# Patient Record
Sex: Female | Born: 1950 | Race: White | Hispanic: No | Marital: Married | Smoking: Former smoker
Health system: Southern US, Community
[De-identification: ages and names within clinical notes are randomized; demographics above are authoritative.]

## PROBLEM LIST (undated history)

## (undated) DIAGNOSIS — B029 Zoster without complications: Secondary | ICD-10-CM

## (undated) DIAGNOSIS — K5792 Diverticulitis of intestine, part unspecified, without perforation or abscess without bleeding: Secondary | ICD-10-CM

## (undated) DIAGNOSIS — Z87442 Personal history of urinary calculi: Secondary | ICD-10-CM

## (undated) DIAGNOSIS — M1990 Unspecified osteoarthritis, unspecified site: Secondary | ICD-10-CM

## (undated) HISTORY — PX: BREAST CYST ASPIRATION: SHX578

---

## 1992-03-09 HISTORY — PX: TUBAL LIGATION: SHX77

## 2013-12-21 ENCOUNTER — Other Ambulatory Visit: Payer: Self-pay | Admitting: Orthopedic Surgery

## 2013-12-21 NOTE — Progress Notes (Signed)
Preoperative surgical orders have been place into the Epic hospital system for Marisue Humble on 12/21/2013, 1:09 PM  by Mickel Crow for surgery on 01/10/2014.  Preop Total Hip - Anterior Approach orders including Experel Injecion, IV Tylenol, and IV Decadron as long as there are no contraindications to the above medications. Arlee Muslim, PA-C

## 2013-12-27 ENCOUNTER — Encounter (HOSPITAL_COMMUNITY): Payer: Self-pay | Admitting: Pharmacy Technician

## 2014-01-02 ENCOUNTER — Encounter (HOSPITAL_COMMUNITY)
Admission: RE | Admit: 2014-01-02 | Discharge: 2014-01-02 | Disposition: A | Discharge status: Home / Self Care | Payer: BC Managed Care – PPO | Source: Ambulatory Visit | Attending: Orthopedic Surgery | Admitting: Orthopedic Surgery

## 2014-01-02 ENCOUNTER — Encounter (HOSPITAL_COMMUNITY): Payer: Self-pay

## 2014-01-02 ENCOUNTER — Ambulatory Visit (HOSPITAL_COMMUNITY)
Admission: RE | Admit: 2014-01-02 | Discharge: 2014-01-02 | Disposition: A | Discharge status: Home / Self Care | Payer: BC Managed Care – PPO | Source: Ambulatory Visit | Attending: Orthopedic Surgery | Admitting: Orthopedic Surgery

## 2014-01-02 DIAGNOSIS — Z01818 Encounter for other preprocedural examination: Secondary | ICD-10-CM | POA: Insufficient documentation

## 2014-01-02 DIAGNOSIS — M1611 Unilateral primary osteoarthritis, right hip: Secondary | ICD-10-CM | POA: Diagnosis not present

## 2014-01-02 HISTORY — DX: Zoster without complications: B02.9

## 2014-01-02 HISTORY — DX: Personal history of urinary calculi: Z87.442

## 2014-01-02 HISTORY — DX: Diverticulitis of intestine, part unspecified, without perforation or abscess without bleeding: K57.92

## 2014-01-02 HISTORY — DX: Unspecified osteoarthritis, unspecified site: M19.90

## 2014-01-02 LAB — CBC
HEMATOCRIT: 36.6 % (ref 36.0–46.0)
HEMOGLOBIN: 12.2 g/dL (ref 12.0–15.0)
MCH: 29.6 pg (ref 26.0–34.0)
MCHC: 33.3 g/dL (ref 30.0–36.0)
MCV: 88.8 fL (ref 78.0–100.0)
Platelets: 293 10*3/uL (ref 150–400)
RBC: 4.12 MIL/uL (ref 3.87–5.11)
RDW: 13.3 % (ref 11.5–15.5)
WBC: 4.4 10*3/uL (ref 4.0–10.5)

## 2014-01-02 LAB — ABO/RH: ABO/RH(D): A POS

## 2014-01-02 LAB — COMPREHENSIVE METABOLIC PANEL
ALT: 18 U/L (ref 0–35)
ANION GAP: 12 (ref 5–15)
AST: 23 U/L (ref 0–37)
Albumin: 4.5 g/dL (ref 3.5–5.2)
Alkaline Phosphatase: 66 U/L (ref 39–117)
BUN: 17 mg/dL (ref 6–23)
CALCIUM: 9.8 mg/dL (ref 8.4–10.5)
CO2: 27 meq/L (ref 19–32)
Chloride: 100 mEq/L (ref 96–112)
Creatinine, Ser: 0.7 mg/dL (ref 0.50–1.10)
GLUCOSE: 93 mg/dL (ref 70–99)
Potassium: 5 mEq/L (ref 3.7–5.3)
SODIUM: 139 meq/L (ref 137–147)
Total Bilirubin: 0.4 mg/dL (ref 0.3–1.2)
Total Protein: 7.4 g/dL (ref 6.0–8.3)

## 2014-01-02 LAB — URINALYSIS, ROUTINE W REFLEX MICROSCOPIC
Bilirubin Urine: NEGATIVE
GLUCOSE, UA: NEGATIVE mg/dL
HGB URINE DIPSTICK: NEGATIVE
Ketones, ur: NEGATIVE mg/dL
LEUKOCYTES UA: NEGATIVE
Nitrite: NEGATIVE
Protein, ur: NEGATIVE mg/dL
Specific Gravity, Urine: 1.006 (ref 1.005–1.030)
Urobilinogen, UA: 0.2 mg/dL (ref 0.0–1.0)
pH: 6 (ref 5.0–8.0)

## 2014-01-02 LAB — APTT: aPTT: 29 seconds (ref 24–37)

## 2014-01-02 LAB — SURGICAL PCR SCREEN
MRSA, PCR: NEGATIVE
STAPHYLOCOCCUS AUREUS: POSITIVE — AB

## 2014-01-02 LAB — PROTIME-INR
INR: 1.05 (ref 0.00–1.49)
Prothrombin Time: 13.8 seconds (ref 11.6–15.2)

## 2014-01-02 NOTE — Pre-Procedure Instructions (Signed)
NOTE OF MEDICAL CLEARANCE ON PT'S CHART FROM DR. SHAMLEFFER. EKG AND CXR NOT NEEDED PREOP PER PT'S MEDICAL HISTORY / ANESTHESIOLOGIST'S GUIDELINES.

## 2014-01-02 NOTE — Patient Instructions (Addendum)
YOUR SURGERY IS SCHEDULED AT [facility]  ON  Wednesday  11/4  REPORT TO  SHORT STAY CENTER AT:  8:45 AM   DO NOT EAT OR DRINK ANYTHING AFTER MIDNIGHT THE NIGHT BEFORE YOUR SURGERY.  YOU MAY BRUSH YOUR TEETH, RINSE OUT YOUR MOUTH--BUT NO WATER, NO FOOD, NO CHEWING GUM, NO MINTS, NO CANDIES, NO CHEWING TOBACCO.  PLEASE TAKE THE FOLLOWING MEDICATIONS THE AM OF YOUR SURGERY WITH A FEW SIPS OF WATER:  NO MEDICATIONS TO TAKE    DO NOT BRING VALUABLES, MONEY, CREDIT CARDS.  DO NOT WEAR JEWELRY, MAKE-UP, NAIL POLISH AND NO METAL PINS OR CLIPS IN YOUR HAIR. CONTACT LENS, DENTURES / PARTIALS, GLASSES SHOULD NOT BE WORN TO SURGERY AND IN MOST CASES-HEARING AIDS WILL NEED TO BE REMOVED.  BRING YOUR GLASSES CASE, ANY EQUIPMENT NEEDED FOR YOUR CONTACT LENS. FOR PATIENTS ADMITTED TO THE HOSPITAL--CHECK OUT TIME THE DAY OF DISCHARGE IS 11:00 AM.  ALL INPATIENT ROOMS ARE PRIVATE - WITH BATHROOM, TELEPHONE, TELEVISION AND WIFI INTERNET.    PLEASE BE AWARE THAT YOU MAY NEED ADDITIONAL BLOOD DRAWN DAY OF YOUR SURGERY  _______________________________________________________________________   [facility] - Preparing for Surgery Before surgery, you can play an important role.  Because skin is not sterile, your skin needs to be as free of germs as possible.  You can reduce the number of germs on your skin by washing with CHG (chlorahexidine gluconate) soap before surgery.  CHG is an antiseptic cleaner which kills germs and bonds with the skin to continue killing germs even after washing. Please DO NOT use if you have an allergy to CHG or antibacterial soaps.  If your skin becomes reddened/irritated stop using the CHG and inform your nurse when you arrive at Short Stay. Do not shave (including legs and underarms) for at least 48 hours prior to the first CHG shower.  You may shave your face/neck. Please follow these instructions carefully:  1.  Shower with CHG Soap the night before surgery and  the  morning of Surgery.  2.  If you choose to wash your hair, wash your hair first as usual with your  normal  shampoo.  3.  After you shampoo, rinse your hair and body thoroughly to remove the  shampoo.                           4.  Use CHG as you would any other liquid soap.  You can apply chg directly  to the skin and wash                       Gently with a scrungie or clean washcloth.  5.  Apply the CHG Soap to your body ONLY FROM THE NECK DOWN.   Do not use on face/ open                           Wound or open sores. Avoid contact with eyes, ears mouth and genitals (private parts).                       Wash face,  Genitals (private parts) with your normal soap.             6.  Wash thoroughly, paying special attention to the area where your surgery  will be performed.  7.  Thoroughly rinse your body with  warm water from the neck down.  8.  DO NOT shower/wash with your normal soap after using and rinsing off  the CHG Soap.                9.  Pat yourself dry with a clean towel.            10.  Wear clean pajamas.            11.  Place clean sheets on your bed the night of your first shower and do not  sleep with pets. Day of Surgery : Do not apply any lotions/deodorants the morning of surgery.  Please wear clean clothes to the hospital/surgery center.  FAILURE TO FOLLOW THESE INSTRUCTIONS MAY RESULT IN THE CANCELLATION OF YOUR SURGERY PATIENT SIGNATURE_________________________________  NURSE SIGNATURE__________________________________  ________________________________________________________________________   Adam Phenix  An incentive spirometer is a tool that can help keep your lungs clear and active. This tool measures how well you are filling your lungs with each breath. Taking long deep breaths may help reverse or decrease the chance of developing breathing (pulmonary) problems (especially infection) following:  A long period of time when you are unable to move or be  active. BEFORE THE PROCEDURE   If the spirometer includes an indicator to show your best effort, your nurse or respiratory therapist will set it to a desired goal.  If possible, sit up straight or lean slightly forward. Try not to slouch.  Hold the incentive spirometer in an upright position. INSTRUCTIONS FOR USE  1. Sit on the edge of your bed if possible, or sit up as far as you can in bed or on a chair. 2. Hold the incentive spirometer in an upright position. 3. Breathe out normally. 4. Place the mouthpiece in your mouth and seal your lips tightly around it. 5. Breathe in slowly and as deeply as possible, raising the piston or the ball toward the top of the column. 6. Hold your breath for 3-5 seconds or for as long as possible. Allow the piston or ball to fall to the bottom of the column. 7. Remove the mouthpiece from your mouth and breathe out normally. 8. Rest for a few seconds and repeat Steps 1 through 7 at least 10 times every 1-2 hours when you are awake. Take your time and take a few normal breaths between deep breaths. 9. The spirometer may include an indicator to show your best effort. Use the indicator as a goal to work toward during each repetition. 10. After each set of 10 deep breaths, practice coughing to be sure your lungs are clear. If you have an incision (the cut made at the time of surgery), support your incision when coughing by placing a pillow or rolled up towels firmly against it. Once you are able to get out of bed, walk around indoors and cough well. You may stop using the incentive spirometer when instructed by your caregiver.  RISKS AND COMPLICATIONS  Take your time so you do not get dizzy or light-headed.  If you are in pain, you may need to take or ask for pain medication before doing incentive spirometry. It is harder to take a deep breath if you are having pain. AFTER USE  Rest and breathe slowly and easily.  It can be helpful to keep track of a log of  your progress. Your caregiver can provide you with a simple table to help with this. If you are using the spirometer at home, follow these  instructions: SEEK MEDICAL CARE IF:   You are having difficultly using the spirometer.  You have trouble using the spirometer as often as instructed.  Your pain medication is not giving enough relief while using the spirometer.  You develop fever of 100.5 F (38.1 C) or higher. SEEK IMMEDIATE MEDICAL CARE IF:   You cough up bloody sputum that had not been present before.  You develop fever of 102 F (38.9 C) or greater.  You develop worsening pain at or near the incision site. MAKE SURE YOU:   Understand these instructions.  Will watch your condition.  Will get help right away if you are not doing well or get worse. Document Released: 07/06/2006 Document Revised: 05/18/2011 Document Reviewed: 09/06/2006 Exit[employer] Patient Information 2014 Exit[employer], [place].   ________________________________________________________________________  WHAT IS A BLOOD TRANSFUSION? Blood Transfusion Information  A transfusion is the replacement of blood or some of its parts. Blood is made up of multiple cells which provide different functions.  Red blood cells carry oxygen and are used for blood loss replacement.  White blood cells fight against infection.  Platelets control bleeding.  Plasma helps clot blood.  Other blood products are available for specialized needs, such as hemophilia or other clotting disorders. BEFORE THE TRANSFUSION  Who gives blood for transfusions?   Healthy volunteers who are fully evaluated to make sure their blood is safe. This is blood bank blood. Transfusion therapy is the safest it has ever been in the practice of medicine. Before blood is taken from a donor, a complete history is taken to make sure that person has no history of diseases nor engages in risky social behavior (examples are intravenous drug use or sexual activity  with multiple partners). The donor's travel history is screened to minimize risk of transmitting infections, such as malaria. The donated blood is tested for signs of infectious diseases, such as HIV and hepatitis. The blood is then tested to be sure it is compatible with you in order to minimize the chance of a transfusion reaction. If you or a relative donates blood, this is often done in anticipation of surgery and is not appropriate for emergency situations. It takes many days to process the donated blood. RISKS AND COMPLICATIONS Although transfusion therapy is very safe and saves many lives, the main dangers of transfusion include:   Getting an infectious disease.  Developing a transfusion reaction. This is an allergic reaction to something in the blood you were given. Every precaution is taken to prevent this. The decision to have a blood transfusion has been considered carefully by your caregiver before blood is given. Blood is not given unless the benefits outweigh the risks. AFTER THE TRANSFUSION  Right after receiving a blood transfusion, you will usually feel much better and more energetic. This is especially true if your red blood cells have gotten low (anemic). The transfusion raises the level of the red blood cells which carry oxygen, and this usually causes an energy increase.  The nurse administering the transfusion will monitor you carefully for complications. HOME CARE INSTRUCTIONS  No special instructions are needed after a transfusion. You may find your energy is better. Speak with your caregiver about any limitations on activity for underlying diseases you may have. SEEK MEDICAL CARE IF:   Your condition is not improving after your transfusion.  You develop redness or irritation at the intravenous (IV) site. SEEK IMMEDIATE MEDICAL CARE IF:  Any of the following symptoms occur over the next 12 hours:  Shaking  chills.  You have a temperature by mouth above 102 F (38.9  C), not controlled by medicine.  Chest, back, or muscle pain.  People around you feel you are not acting correctly or are confused.  Shortness of breath or difficulty breathing.  Dizziness and fainting.  You get a rash or develop hives.  You have a decrease in urine output.  Your urine turns a dark color or changes to pink, red, or brown. Any of the following symptoms occur over the next 10 days:  You have a temperature by mouth above 102 F (38.9 C), not controlled by medicine.  Shortness of breath.  Weakness after normal activity.  The white part of the eye turns yellow (jaundice).  You have a decrease in the amount of urine or are urinating less often.  Your urine turns a dark color or changes to pink, red, or brown. Document Released: 02/21/2000 Document Revised: 05/18/2011 Document Reviewed: 10/10/2007 [facility] Patient Information 2014 [place], [place].  _______________________________________________________________________

## 2014-01-04 ENCOUNTER — Inpatient Hospital Stay (HOSPITAL_COMMUNITY): Admission: RE | Admit: 2014-01-04 | Payer: BC Managed Care – PPO | Source: Ambulatory Visit

## 2014-01-09 ENCOUNTER — Ambulatory Visit: Payer: Self-pay | Admitting: Orthopedic Surgery

## 2014-01-09 NOTE — H&P (Signed)
Autumn Lara DOB: 03-06-1951 Married / Language: English / Race: White Female Date of Admission:  01/10/2014 Chief Complaint:  Right Hip Pain History of Present Illness (Tyheem Boughner L. Rya Rausch III PA-C; 12/21/2013 2:22 PM) The patient is a 63 year old female who comes in today for a preoperative History and Physical. The patient is scheduled for a right total hip arthroplasty (anterior approach) to be performed by Dr. Dione Plover. Aluisio, MD at [facility] on 01/10/2014. The patient is a 63 year old female who presents with a hip problem. The patient is here today for a second opinion.The patient reports right hip problems including pain symptoms that have been present for 5 year(s). The symptoms began without any known injury. Symptoms reported include hip pain The patient reports symptoms radiating to the: right groin and right thigh anteriorly (medial). Onset of symptoms was gradual.The patient feels as if their symptoms are does feel they are worsening. Prior to being seen today the patient was previously evaluated by an out of town physician. Previous workup for this problem has included hip x-rays. Previous treatment for this problem has included corticosteroid injection (only helped for about two weeks) and nonsteroidal anti-inflammatory drugs (ibuprofen). She states her hip is getting progressively worse. She and her husband recently moved here from [place]. She was treated by an orthopedic surgeon in [place], who stated that she would eventually need to have her hip replaced. Dr. Kellie Moor had treated her. She states that the hip is hurting with almost all activities now. She still continues to golf but the golfing is getting more difficult. She says that is her main activity that she enjoys. Regular everyday activities are also getting much more difficult for her. She's had a previous intra-articular hip injection that only provided a short-term benefit for her of less than 2 weeks.  She does get pain at night which wakes her up. She is currently not taking any medications other than occasional ibuprofen for this. The medications tend not to provide much benefit. She is ready to proceed surgery. They have been treated conservatively in the past for the above stated problem and despite conservative measures, they continue to have progressive pain and severe functional limitations and dysfunction. They have failed non-operative management including home exercise, medications. It is felt that they would benefit from undergoing total joint replacement. Risks and benefits of the procedure have been discussed with the patient and they elect to proceed with surgery. There are no active contraindications to surgery such as ongoing infection or rapidly progressive neurological disease.  Problem List/Past Medical (Graison Leinberger Monika Salk, III PA-C; 12/21/2013 2:26 PM) Osteoarthritis of right hip, unspecified osteoarthritis type (M16.11) Shingles Diverticulosis Kidney Stone Menopause  Allergies (Theadore Blunck L Jariah Tarkowski, III PA-C; 12/21/2013 2:05 PM) No Known Drug Allergies05/14/2015  Family History (Akil Hoos Monika Salk, III PA-C; 12/21/2013 2:32 PM) Mother Cerebral Hemmorhage  Social History (Raymone Pembroke Monika Salk, III PA-C; 12/21/2013 2:05 PM) Number of flights of stairs before winded greater than 5 Not under pain contract No history of drug/alcohol rehab Tobacco / smoke exposure 07/20/2013: no Tobacco use Never smoker. 07/20/2013 Marital status married Current drinker 07/20/2013: Currently drinks wine 5-7 times per week Children 5 or more Living situation live with spouse Exercise Exercises weekly; does running / walking and gym / weights Current work status retired  Medication History (Maharishi [place], III PA-C; 12/21/2013 2:33 PM) Aleve (220MG  Tablet, Oral) Active.  Pregnancy / Birth History (Inaya Gillham Monika Salk, III PA-C; 12/21/2013 2:05  PM) Pregnant no  Past Surgical History (Jarquis Walker Monika Salk, III PA-C; 12/21/2013 2:28 PM) Tubal Ligation Date: 47.   Review of Systems (Keiden Deskin L. Ikea Demicco III PA-C; 12/21/2013 2:28 PM) General Not Present- Chills, Fatigue, Fever, Memory Loss, Night Sweats, Weight Gain and Weight Loss. Skin Not Present- Eczema, Hives, Itching, Lesions and Rash. HEENT Not Present- Dentures, Double Vision, Headache, Hearing Loss, Tinnitus and Visual Loss. Respiratory Not Present- Allergies, Chronic Cough, Coughing up blood, Shortness of breath at rest and Shortness of breath with exertion. Cardiovascular Not Present- Chest Pain, Difficulty Breathing Lying Down, Murmur, Palpitations, Racing/skipping heartbeats and Swelling. Gastrointestinal Not Present- Abdominal Pain, Bloody Stool, Constipation, Diarrhea, Difficulty Swallowing, Heartburn, Jaundice, Loss of appetitie, Nausea and Vomiting. Female Genitourinary Not Present- Blood in Urine, Discharge, Flank Pain, Incontinence, Painful Urination, Urgency, Urinary frequency, Urinary Retention, Urinating at Night and Weak urinary stream. Musculoskeletal Present- Morning Stiffness. Not Present- Back Pain, Joint Pain, Joint Swelling, Muscle Pain, Muscle Weakness and Spasms. Neurological Not Present- Blackout spells, Difficulty with balance, Dizziness, Paralysis, Tremor and Weakness. Psychiatric Not Present- Insomnia. Vitals (Roshelle Traub L. Roran Wegner III PA-C; 12/21/2013 2:37 PM) 12/21/2013 2:35 PM Weight: 145 lb Height: 65in Weight was reported by patient. Height was reported by patient. Body Surface Area: 1.74 m Body Mass Index: 24.13 kg/m  BP: 130/78 (Sitting, Right Arm, Standard)   Physical Exam (Tonye Tancredi L. Yaiza Palazzola III PA-C; 12/21/2013 2:37 PM) General Mental Status -Alert, cooperative and good historian. General Appearance-pleasant, Not in acute distress. Orientation-Oriented X3. Build & Nutrition-Well nourished and Well  developed.  Head and Neck Head-normocephalic, atraumatic . Neck Global Assessment - supple, no bruit auscultated on the right, no bruit auscultated on the left.  Eye Vision-Wears corrective lenses. Pupil - Bilateral-Regular and Round. Motion - Bilateral-EOMI.  Chest and Lung Exam Auscultation Breath sounds - clear at anterior chest wall and clear at posterior chest wall. Adventitious sounds - No Adventitious sounds.  Cardiovascular Auscultation Rhythm - Regular rate and rhythm. Heart Sounds - S1 WNL and S2 WNL. Murmurs & Other Heart Sounds - Auscultation of the heart reveals - No Murmurs.  Abdomen Palpation/Percussion Tenderness - Abdomen is non-tender to palpation. Rigidity (guarding) - Abdomen is soft. Auscultation Auscultation of the abdomen reveals - Bowel sounds normal.  Female Genitourinary Note: Not done, not pertinent to present illness   Musculoskeletal Note: On exam, she's a very pleasant, well-developed female, A&O, in no apparent distress. She is accompanied today by her husband. Her left hip has normal ROM with no discomfort. Her right hip can be flexed to about 100. Minimal internal rotation, about 10 degrees of external rotation, 10-20 degrees of abduction. Her knee exam is normal on that side. Pulses, sensation and motor are intact on that side. She does have a slightly antalgic gait pattern.  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   Assessment & Plan (Dhaval Woo L. Majid Mccravy III PA-C; 12/21/2013 2:29 PM) Osteoarthritis of right hip, unspecified osteoarthritis type (M16.11) Note:Plan is for a Right Total Hip Replacement by Dr. Wynelle Link.  Plan is to go home.  PCP - Dr. Kelton Pillar - Patient has been seen preoperatively and felt to be stable for surgery.  The patient does  not have any contraindications and will receive TXA (tranexamic acid) prior to surgery.  Signed electronically by Joelene Millin, III PA-C

## 2014-01-10 ENCOUNTER — Inpatient Hospital Stay (HOSPITAL_COMMUNITY)
Admission: RE | Admit: 2014-01-10 | Discharge: 2014-01-12 | DRG: 470 | Disposition: A | Discharge status: Home Health | Payer: BC Managed Care – PPO | Source: Ambulatory Visit | Attending: Orthopedic Surgery | Admitting: Orthopedic Surgery

## 2014-01-10 ENCOUNTER — Inpatient Hospital Stay (HOSPITAL_COMMUNITY): Payer: BC Managed Care – PPO

## 2014-01-10 ENCOUNTER — Inpatient Hospital Stay (HOSPITAL_COMMUNITY): Payer: BC Managed Care – PPO | Admitting: Anesthesiology

## 2014-01-10 ENCOUNTER — Encounter (HOSPITAL_COMMUNITY)
Admission: RE | Disposition: A | Discharge status: Home Health | Payer: Self-pay | Source: Ambulatory Visit | Attending: Orthopedic Surgery

## 2014-01-10 ENCOUNTER — Encounter (HOSPITAL_COMMUNITY): Payer: Self-pay | Admitting: *Deleted

## 2014-01-10 DIAGNOSIS — Z6824 Body mass index (BMI) 24.0-24.9, adult: Secondary | ICD-10-CM

## 2014-01-10 DIAGNOSIS — Z87442 Personal history of urinary calculi: Secondary | ICD-10-CM

## 2014-01-10 DIAGNOSIS — Z87891 Personal history of nicotine dependence: Secondary | ICD-10-CM | POA: Diagnosis not present

## 2014-01-10 DIAGNOSIS — M169 Osteoarthritis of hip, unspecified: Secondary | ICD-10-CM | POA: Diagnosis present

## 2014-01-10 DIAGNOSIS — Z96649 Presence of unspecified artificial hip joint: Secondary | ICD-10-CM

## 2014-01-10 DIAGNOSIS — M1611 Unilateral primary osteoarthritis, right hip: Secondary | ICD-10-CM

## 2014-01-10 DIAGNOSIS — M25551 Pain in right hip: Secondary | ICD-10-CM | POA: Diagnosis present

## 2014-01-10 HISTORY — PX: TOTAL HIP ARTHROPLASTY: SHX124

## 2014-01-10 LAB — TYPE AND SCREEN
ABO/RH(D): A POS
Antibody Screen: NEGATIVE

## 2014-01-10 SURGERY — ARTHROPLASTY, HIP, TOTAL, ANTERIOR APPROACH
Anesthesia: Spinal | Site: Hip | Laterality: Right

## 2014-01-10 MED ORDER — PHENYLEPHRINE HCL 10 MG/ML IJ SOLN
INTRAMUSCULAR | Status: DC | PRN
Start: 2014-01-10 — End: 2014-01-10
  Administered 2014-01-10: 40 ug via INTRAVENOUS

## 2014-01-10 MED ORDER — KETOROLAC TROMETHAMINE 15 MG/ML IJ SOLN
7.5000 mg | Freq: Four times a day (QID) | INTRAMUSCULAR | Status: AC | PRN
  Administered 2014-01-10: 7.5 mg via INTRAVENOUS
  Filled 2014-01-10: qty 1

## 2014-01-10 MED ORDER — ACETAMINOPHEN 650 MG RE SUPP: 650.0000 mg | Freq: Four times a day (QID) | RECTAL | Status: DC | PRN

## 2014-01-10 MED ORDER — ACETAMINOPHEN 10 MG/ML IV SOLN
1000.0000 mg | Freq: Once | INTRAVENOUS | Status: AC
  Administered 2014-01-10: 1000 mg via INTRAVENOUS
  Filled 2014-01-10: qty 100

## 2014-01-10 MED ORDER — CEFAZOLIN SODIUM-DEXTROSE 2-3 GM-% IV SOLR
2.0000 g | INTRAVENOUS | Status: AC
  Administered 2014-01-10: 2 g via INTRAVENOUS

## 2014-01-10 MED ORDER — ONDANSETRON HCL 4 MG PO TABS: 4.0000 mg | ORAL_TABLET | Freq: Four times a day (QID) | ORAL | Status: DC | PRN

## 2014-01-10 MED ORDER — CEFAZOLIN SODIUM-DEXTROSE 2-3 GM-% IV SOLR
INTRAVENOUS | Status: AC
  Filled 2014-01-10: qty 50

## 2014-01-10 MED ORDER — TRAMADOL HCL 50 MG PO TABS: 50.0000 mg | ORAL_TABLET | Freq: Four times a day (QID) | ORAL | Status: DC | PRN

## 2014-01-10 MED ORDER — MORPHINE SULFATE 2 MG/ML IJ SOLN: 1.0000 mg | INTRAMUSCULAR | Status: DC | PRN

## 2014-01-10 MED ORDER — PHENYLEPHRINE 40 MCG/ML (10ML) SYRINGE FOR IV PUSH (FOR BLOOD PRESSURE SUPPORT)
PREFILLED_SYRINGE | INTRAVENOUS | Status: AC
  Filled 2014-01-10: qty 10

## 2014-01-10 MED ORDER — GLYCOPYRROLATE 0.2 MG/ML IJ SOLN
INTRAMUSCULAR | Status: DC | PRN
  Administered 2014-01-10: 0.2 mg via INTRAVENOUS

## 2014-01-10 MED ORDER — DIPHENHYDRAMINE HCL 12.5 MG/5ML PO ELIX: 12.5000 mg | ORAL_SOLUTION | ORAL | Status: DC | PRN

## 2014-01-10 MED ORDER — ACETAMINOPHEN 325 MG PO TABS
650.0000 mg | ORAL_TABLET | Freq: Four times a day (QID) | ORAL | Status: DC | PRN
Start: 2014-01-11 — End: 2014-01-12
  Administered 2014-01-12: 650 mg via ORAL
  Filled 2014-01-10: qty 2

## 2014-01-10 MED ORDER — BUPIVACAINE HCL (PF) 0.5 % IJ SOLN
INTRAMUSCULAR | Status: DC | PRN
  Administered 2014-01-10: 12.5 mg

## 2014-01-10 MED ORDER — RIVAROXABAN 10 MG PO TABS
10.0000 mg | ORAL_TABLET | Freq: Every day | ORAL | Status: DC
  Administered 2014-01-11 – 2014-01-12 (×2): 10 mg via ORAL
  Filled 2014-01-10 (×3): qty 1

## 2014-01-10 MED ORDER — PROPOFOL 10 MG/ML IV BOLUS
INTRAVENOUS | Status: AC
  Filled 2014-01-10: qty 20

## 2014-01-10 MED ORDER — ONDANSETRON HCL 4 MG/2ML IJ SOLN: 4.0000 mg | Freq: Four times a day (QID) | INTRAMUSCULAR | Status: DC | PRN

## 2014-01-10 MED ORDER — DEXAMETHASONE SODIUM PHOSPHATE 10 MG/ML IJ SOLN
INTRAMUSCULAR | Status: AC
  Filled 2014-01-10: qty 1

## 2014-01-10 MED ORDER — HYDROMORPHONE HCL 1 MG/ML IJ SOLN: 0.2500 mg | INTRAMUSCULAR | Status: DC | PRN

## 2014-01-10 MED ORDER — METOCLOPRAMIDE HCL 5 MG/ML IJ SOLN: 5.0000 mg | Freq: Three times a day (TID) | INTRAMUSCULAR | Status: DC | PRN

## 2014-01-10 MED ORDER — METOCLOPRAMIDE HCL 10 MG PO TABS: 5.0000 mg | ORAL_TABLET | Freq: Three times a day (TID) | ORAL | Status: DC | PRN

## 2014-01-10 MED ORDER — PROPOFOL INFUSION 10 MG/ML OPTIME
INTRAVENOUS | Status: DC | PRN
  Administered 2014-01-10: 50 ug/kg/min via INTRAVENOUS

## 2014-01-10 MED ORDER — PROMETHAZINE HCL 25 MG/ML IJ SOLN: 6.2500 mg | INTRAMUSCULAR | Status: DC | PRN

## 2014-01-10 MED ORDER — PROPOFOL 10 MG/ML IV BOLUS
INTRAVENOUS | Status: AC
Start: 2014-01-10 — End: 2014-01-10
  Filled 2014-01-10: qty 20

## 2014-01-10 MED ORDER — PHENOL 1.4 % MT LIQD
1.0000 | OROMUCOSAL | Status: DC | PRN
  Filled 2014-01-10: qty 177

## 2014-01-10 MED ORDER — DEXTROSE 5 % IV SOLN
500.0000 mg | Freq: Four times a day (QID) | INTRAVENOUS | Status: DC | PRN
  Administered 2014-01-10: 500 mg via INTRAVENOUS
  Filled 2014-01-10 (×2): qty 5

## 2014-01-10 MED ORDER — 0.9 % SODIUM CHLORIDE (POUR BTL) OPTIME
TOPICAL | Status: DC | PRN
  Administered 2014-01-10: 1000 mL

## 2014-01-10 MED ORDER — ONDANSETRON HCL 4 MG/2ML IJ SOLN
INTRAMUSCULAR | Status: DC | PRN
Start: 2014-01-10 — End: 2014-01-10
  Administered 2014-01-10: 4 mg via INTRAVENOUS

## 2014-01-10 MED ORDER — BUPIVACAINE LIPOSOME 1.3 % IJ SUSP
INTRAMUSCULAR | Status: DC | PRN
  Administered 2014-01-10: 20 mL

## 2014-01-10 MED ORDER — CHLORHEXIDINE GLUCONATE 4 % EX LIQD: 60.0000 mL | Freq: Once | CUTANEOUS | Status: DC

## 2014-01-10 MED ORDER — SODIUM CHLORIDE 0.9 % IJ SOLN
INTRAMUSCULAR | Status: AC
  Filled 2014-01-10: qty 50

## 2014-01-10 MED ORDER — DOCUSATE SODIUM 100 MG PO CAPS
100.0000 mg | ORAL_CAPSULE | Freq: Two times a day (BID) | ORAL | Status: DC
  Administered 2014-01-10 – 2014-01-12 (×4): 100 mg via ORAL

## 2014-01-10 MED ORDER — STERILE WATER FOR IRRIGATION IR SOLN
Status: DC | PRN
  Administered 2014-01-10: 1500 mL

## 2014-01-10 MED ORDER — DEXTROSE-NACL 5-0.9 % IV SOLN
INTRAVENOUS | Status: DC
  Administered 2014-01-10: 75 mL/h via INTRAVENOUS
  Administered 2014-01-11: 06:00:00 via INTRAVENOUS

## 2014-01-10 MED ORDER — PROPOFOL 10 MG/ML IV BOLUS
INTRAVENOUS | Status: DC | PRN
  Administered 2014-01-10 (×3): 10 mg via INTRAVENOUS

## 2014-01-10 MED ORDER — OXYCODONE HCL 5 MG PO TABS
5.0000 mg | ORAL_TABLET | ORAL | Status: DC | PRN
  Administered 2014-01-10 (×3): 5 mg via ORAL
  Administered 2014-01-11 (×3): 10 mg via ORAL
  Administered 2014-01-11: 5 mg via ORAL
  Administered 2014-01-12: 10 mg via ORAL
  Administered 2014-01-12: 5 mg via ORAL
  Filled 2014-01-10: qty 2
  Filled 2014-01-10 (×3): qty 1
  Filled 2014-01-10 (×4): qty 2
  Filled 2014-01-10: qty 1
  Filled 2014-01-10: qty 2

## 2014-01-10 MED ORDER — MIDAZOLAM HCL 5 MG/5ML IJ SOLN
INTRAMUSCULAR | Status: DC | PRN
  Administered 2014-01-10 (×2): 1 mg via INTRAVENOUS

## 2014-01-10 MED ORDER — MENTHOL 3 MG MT LOZG
1.0000 | LOZENGE | OROMUCOSAL | Status: DC | PRN
  Filled 2014-01-10: qty 9

## 2014-01-10 MED ORDER — FENTANYL CITRATE 0.05 MG/ML IJ SOLN
INTRAMUSCULAR | Status: AC
  Filled 2014-01-10: qty 2

## 2014-01-10 MED ORDER — BISACODYL 10 MG RE SUPP: 10.0000 mg | Freq: Every day | RECTAL | Status: DC | PRN

## 2014-01-10 MED ORDER — SODIUM CHLORIDE 0.9 % IV SOLN: INTRAVENOUS | Status: DC

## 2014-01-10 MED ORDER — PHENYLEPHRINE HCL 10 MG/ML IJ SOLN
INTRAMUSCULAR | Status: AC
  Filled 2014-01-10: qty 1

## 2014-01-10 MED ORDER — BUPIVACAINE HCL (PF) 0.5 % IJ SOLN
INTRAMUSCULAR | Status: AC
  Filled 2014-01-10: qty 30

## 2014-01-10 MED ORDER — FLEET ENEMA 7-19 GM/118ML RE ENEM: 1.0000 | ENEMA | Freq: Once | RECTAL | Status: AC | PRN

## 2014-01-10 MED ORDER — BUPIVACAINE LIPOSOME 1.3 % IJ SUSP
20.0000 mL | Freq: Once | INTRAMUSCULAR | Status: DC
  Filled 2014-01-10: qty 20

## 2014-01-10 MED ORDER — GLYCOPYRROLATE 0.2 MG/ML IJ SOLN
INTRAMUSCULAR | Status: AC
  Filled 2014-01-10: qty 1

## 2014-01-10 MED ORDER — PHENYLEPHRINE HCL 10 MG/ML IJ SOLN
10.0000 mg | INTRAVENOUS | Status: DC | PRN
  Administered 2014-01-10: 40 ug/min via INTRAVENOUS

## 2014-01-10 MED ORDER — LACTATED RINGERS IV SOLN
INTRAVENOUS | Status: DC
  Administered 2014-01-10: 12:00:00 via INTRAVENOUS
  Administered 2014-01-10: 1000 mL via INTRAVENOUS
  Administered 2014-01-10: 13:00:00 via INTRAVENOUS

## 2014-01-10 MED ORDER — BUPIVACAINE HCL (PF) 0.25 % IJ SOLN
INTRAMUSCULAR | Status: DC | PRN
  Administered 2014-01-10: 20 mL

## 2014-01-10 MED ORDER — ONDANSETRON HCL 4 MG/2ML IJ SOLN
INTRAMUSCULAR | Status: AC
  Filled 2014-01-10: qty 2

## 2014-01-10 MED ORDER — MIDAZOLAM HCL 2 MG/2ML IJ SOLN
INTRAMUSCULAR | Status: AC
  Filled 2014-01-10: qty 2

## 2014-01-10 MED ORDER — TRANEXAMIC ACID 100 MG/ML IV SOLN
1000.0000 mg | INTRAVENOUS | Status: AC
  Administered 2014-01-10: 1000 mg via INTRAVENOUS
  Filled 2014-01-10: qty 10

## 2014-01-10 MED ORDER — SODIUM CHLORIDE 0.9 % IJ SOLN
INTRAMUSCULAR | Status: DC | PRN
  Administered 2014-01-10: 30 mL

## 2014-01-10 MED ORDER — CEFAZOLIN SODIUM-DEXTROSE 2-3 GM-% IV SOLR
2.0000 g | Freq: Four times a day (QID) | INTRAVENOUS | Status: AC
  Administered 2014-01-10 – 2014-01-11 (×2): 2 g via INTRAVENOUS
  Filled 2014-01-10 (×2): qty 50

## 2014-01-10 MED ORDER — BUPIVACAINE HCL (PF) 0.25 % IJ SOLN
INTRAMUSCULAR | Status: AC
  Filled 2014-01-10: qty 30

## 2014-01-10 MED ORDER — POLYETHYLENE GLYCOL 3350 17 G PO PACK
17.0000 g | PACK | Freq: Every day | ORAL | Status: DC | PRN
  Administered 2014-01-12: 17 g via ORAL
  Filled 2014-01-10: qty 1

## 2014-01-10 MED ORDER — ACETAMINOPHEN 500 MG PO TABS
1000.0000 mg | ORAL_TABLET | Freq: Four times a day (QID) | ORAL | Status: AC
  Administered 2014-01-10 – 2014-01-11 (×4): 1000 mg via ORAL
  Filled 2014-01-10 (×4): qty 2

## 2014-01-10 MED ORDER — FENTANYL CITRATE 0.05 MG/ML IJ SOLN
INTRAMUSCULAR | Status: AC
  Filled 2014-01-10: qty 5

## 2014-01-10 MED ORDER — DEXAMETHASONE SODIUM PHOSPHATE 10 MG/ML IJ SOLN
10.0000 mg | Freq: Once | INTRAMUSCULAR | Status: AC
  Administered 2014-01-11: 10 mg via INTRAVENOUS
  Filled 2014-01-10: qty 1

## 2014-01-10 MED ORDER — METHOCARBAMOL 500 MG PO TABS
500.0000 mg | ORAL_TABLET | Freq: Four times a day (QID) | ORAL | Status: DC | PRN
  Administered 2014-01-10 – 2014-01-12 (×6): 500 mg via ORAL
  Filled 2014-01-10 (×6): qty 1

## 2014-01-10 MED ORDER — DEXAMETHASONE SODIUM PHOSPHATE 10 MG/ML IJ SOLN
10.0000 mg | Freq: Once | INTRAMUSCULAR | Status: AC
  Administered 2014-01-10: 10 mg via INTRAVENOUS

## 2014-01-10 MED ORDER — FENTANYL CITRATE 0.05 MG/ML IJ SOLN
INTRAMUSCULAR | Status: DC | PRN
  Administered 2014-01-10: 50 ug via INTRAVENOUS

## 2014-01-10 MED ORDER — FENTANYL CITRATE 0.05 MG/ML IJ SOLN
50.0000 ug | INTRAMUSCULAR | Status: DC | PRN
  Administered 2014-01-10: 50 ug via INTRAVENOUS

## 2014-01-10 SURGICAL SUPPLY — 38 items
BAG ZIPLOCK 12X15 (MISCELLANEOUS) IMPLANT
BLADE EXTENDED COATED 6.5IN (ELECTRODE) ×3 IMPLANT
BLADE SAG 18X100X1.27 (BLADE) ×3 IMPLANT
CAPT HIP PF COP ×3 IMPLANT
CLOSURE WOUND 1/2 X4 (GAUZE/BANDAGES/DRESSINGS) ×2
COVER PERINEAL POST (MISCELLANEOUS) ×3 IMPLANT
DECANTER SPIKE VIAL GLASS SM (MISCELLANEOUS) ×3 IMPLANT
DRAPE C-ARM 42X120 X-RAY (DRAPES) ×3 IMPLANT
DRAPE STERI IOBAN 125X83 (DRAPES) ×3 IMPLANT
DRAPE U-SHAPE 47X51 STRL (DRAPES) ×9 IMPLANT
DRSG ADAPTIC 3X8 NADH LF (GAUZE/BANDAGES/DRESSINGS) ×3 IMPLANT
DRSG MEPILEX BORDER 4X4 (GAUZE/BANDAGES/DRESSINGS) ×3 IMPLANT
DRSG MEPILEX BORDER 4X8 (GAUZE/BANDAGES/DRESSINGS) ×3 IMPLANT
DURAPREP 26ML APPLICATOR (WOUND CARE) ×3 IMPLANT
ELECT REM PT RETURN 9FT ADLT (ELECTROSURGICAL) ×3
ELECTRODE REM PT RTRN 9FT ADLT (ELECTROSURGICAL) ×1 IMPLANT
EVACUATOR 1/8 PVC DRAIN (DRAIN) ×3 IMPLANT
FACESHIELD WRAPAROUND (MASK) ×9 IMPLANT
GLOVE BIO SURGEON STRL SZ7.5 (GLOVE) ×3 IMPLANT
GLOVE BIO SURGEON STRL SZ8 (GLOVE) ×6 IMPLANT
GLOVE BIOGEL PI IND STRL 8 (GLOVE) ×2 IMPLANT
GLOVE BIOGEL PI INDICATOR 8 (GLOVE) ×4
GOWN STRL REUS W/TWL LRG LVL3 (GOWN DISPOSABLE) ×3 IMPLANT
GOWN STRL REUS W/TWL XL LVL3 (GOWN DISPOSABLE) ×3 IMPLANT
KIT BASIN OR (CUSTOM PROCEDURE TRAY) ×3 IMPLANT
NDL SAFETY ECLIPSE 18X1.5 (NEEDLE) ×2 IMPLANT
NEEDLE HYPO 18GX1.5 SHARP (NEEDLE) ×4
PACK TOTAL JOINT (CUSTOM PROCEDURE TRAY) ×3 IMPLANT
STRIP CLOSURE SKIN 1/2X4 (GAUZE/BANDAGES/DRESSINGS) ×4 IMPLANT
SUT ETHIBOND NAB CT1 #1 30IN (SUTURE) ×3 IMPLANT
SUT MNCRL AB 4-0 PS2 18 (SUTURE) ×3 IMPLANT
SUT VIC AB 2-0 CT1 27 (SUTURE) ×4
SUT VIC AB 2-0 CT1 TAPERPNT 27 (SUTURE) ×2 IMPLANT
SUT VLOC 180 0 24IN GS25 (SUTURE) ×3 IMPLANT
SYR 20CC LL (SYRINGE) ×3 IMPLANT
SYR 50ML LL SCALE MARK (SYRINGE) ×3 IMPLANT
TOWEL OR 17X26 10 PK STRL BLUE (TOWEL DISPOSABLE) ×3 IMPLANT
TRAY FOLEY CATH 14FRSI W/METER (CATHETERS) ×3 IMPLANT

## 2014-01-10 NOTE — H&P (View-Only) (Signed)
Autumn Lara DOB: [birth year] Married / Language: English / Race: White Female Date of Admission:  01/10/2014 Chief Complaint:  Right Hip Pain History of Present Illness (Raynesha Tiedt L. Reese Senk III PA-C; 12/21/2013 2:22 PM) The patient is a 63 year old female who comes in today for a preoperative History and Physical. The patient is scheduled for a right total hip arthroplasty (anterior approach) to be performed by Dr. Dione Plover. Aluisio, MD at [facility] on 01/10/2014. The patient is a 63 year old female who presents with a hip problem. The patient is here today for a second opinion.The patient reports right hip problems including pain symptoms that have been present for 5 year(s). The symptoms began without any known injury. Symptoms reported include hip pain The patient reports symptoms radiating to the: right groin and right thigh anteriorly (medial). Onset of symptoms was gradual.The patient feels as if their symptoms are does feel they are worsening. Prior to being seen today the patient was previously evaluated by an out of town physician. Previous workup for this problem has included hip x-rays. Previous treatment for this problem has included corticosteroid injection (only helped for about two weeks) and nonsteroidal anti-inflammatory drugs (ibuprofen). She states her hip is getting progressively worse. She and her husband recently moved here from [place]. She was treated by an orthopedic surgeon in [place], who stated that she would eventually need to have her hip replaced. Dr. Kellie Moor had treated her. She states that the hip is hurting with almost all activities now. She still continues to golf but the golfing is getting more difficult. She says that is her main activity that she enjoys. Regular everyday activities are also getting much more difficult for her. She's had a previous intra-articular hip injection that only provided a short-term benefit for her of less than 2 weeks.  She does get pain at night which wakes her up. She is currently not taking any medications other than occasional ibuprofen for this. The medications tend not to provide much benefit. She is ready to proceed surgery. They have been treated conservatively in the past for the above stated problem and despite conservative measures, they continue to have progressive pain and severe functional limitations and dysfunction. They have failed non-operative management including home exercise, medications. It is felt that they would benefit from undergoing total joint replacement. Risks and benefits of the procedure have been discussed with the patient and they elect to proceed with surgery. There are no active contraindications to surgery such as ongoing infection or rapidly progressive neurological disease.  Problem List/Past Medical (Leotis Isham Monika Salk, III PA-C; 12/21/2013 2:26 PM) Osteoarthritis of right hip, unspecified osteoarthritis type (M16.11) Shingles Diverticulosis Kidney Stone Menopause  Allergies (Griselle Rufer L Jayla Mackie, III PA-C; 12/21/2013 2:05 PM) No Known Drug Allergies05/14/2015  Family History (Grayce Budden Monika Salk, III PA-C; 12/21/2013 2:32 PM) Mother Cerebral Hemmorhage  Social History (Skylah Delauter Monika Salk, III PA-C; 12/21/2013 2:05 PM) Number of flights of stairs before winded greater than 5 Not under pain contract No history of drug/alcohol rehab Tobacco / smoke exposure 07/20/2013: no Tobacco use Never smoker. 07/20/2013 Marital status married Current drinker 07/20/2013: Currently drinks wine 5-7 times per week Children 5 or more Living situation live with spouse Exercise Exercises weekly; does running / walking and gym / weights Current work status retired  Medication History ([place], III PA-C; 12/21/2013 2:33 PM) Aleve (220MG  Tablet, Oral) Active.  Pregnancy / Birth History (Cataleia Gade Monika Salk, III PA-C; 12/21/2013 2:05  PM) Pregnant no  Past Surgical History (Franciscojavier Wronski Monika Salk, III PA-C; 12/21/2013 2:28 PM) Tubal Ligation Date: 71.   Review of Systems (Spike Desilets L. Reizy Dunlow III PA-C; 12/21/2013 2:28 PM) General Not Present- Chills, Fatigue, Fever, Memory Loss, Night Sweats, Weight Gain and Weight Loss. Skin Not Present- Eczema, Hives, Itching, Lesions and Rash. HEENT Not Present- Dentures, Double Vision, Headache, Hearing Loss, Tinnitus and Visual Loss. Respiratory Not Present- Allergies, Chronic Cough, Coughing up blood, Shortness of breath at rest and Shortness of breath with exertion. Cardiovascular Not Present- Chest Pain, Difficulty Breathing Lying Down, Murmur, Palpitations, Racing/skipping heartbeats and Swelling. Gastrointestinal Not Present- Abdominal Pain, Bloody Stool, Constipation, Diarrhea, Difficulty Swallowing, Heartburn, Jaundice, Loss of appetitie, Nausea and Vomiting. Female Genitourinary Not Present- Blood in Urine, Discharge, Flank Pain, Incontinence, Painful Urination, Urgency, Urinary frequency, Urinary Retention, Urinating at Night and Weak urinary stream. Musculoskeletal Present- Morning Stiffness. Not Present- Back Pain, Joint Pain, Joint Swelling, Muscle Pain, Muscle Weakness and Spasms. Neurological Not Present- Blackout spells, Difficulty with balance, Dizziness, Paralysis, Tremor and Weakness. Psychiatric Not Present- Insomnia. Vitals (Rayli Wiederhold L. Khylin Gutridge III PA-C; 12/21/2013 2:37 PM) 12/21/2013 2:35 PM Weight: 145 lb Height: 65in Weight was reported by patient. Height was reported by patient. Body Surface Area: 1.74 m Body Mass Index: 24.13 kg/m  BP: 130/78 (Sitting, Right Arm, Standard)   Physical Exam (Geffrey Michaelsen L. Iver Fehrenbach III PA-C; 12/21/2013 2:37 PM) General Mental Status -Alert, cooperative and good historian. General Appearance-pleasant, Not in acute distress. Orientation-Oriented X3. Build & Nutrition-Well nourished and Well  developed.  Head and Neck Head-normocephalic, atraumatic . Neck Global Assessment - supple, no bruit auscultated on the right, no bruit auscultated on the left.  Eye Vision-Wears corrective lenses. Pupil - Bilateral-Regular and Round. Motion - Bilateral-EOMI.  Chest and Lung Exam Auscultation Breath sounds - clear at anterior chest wall and clear at posterior chest wall. Adventitious sounds - No Adventitious sounds.  Cardiovascular Auscultation Rhythm - Regular rate and rhythm. Heart Sounds - S1 WNL and S2 WNL. Murmurs & Other Heart Sounds - Auscultation of the heart reveals - No Murmurs.  Abdomen Palpation/Percussion Tenderness - Abdomen is non-tender to palpation. Rigidity (guarding) - Abdomen is soft. Auscultation Auscultation of the abdomen reveals - Bowel sounds normal.  Female Genitourinary Note: Not done, not pertinent to present illness   Musculoskeletal Note: On exam, she's a very pleasant, well-developed female, A&O, in no apparent distress. She is accompanied today by her husband. Her left hip has normal ROM with no discomfort. Her right hip can be flexed to about 100. Minimal internal rotation, about 10 degrees of external rotation, 10-20 degrees of abduction. Her knee exam is normal on that side. Pulses, sensation and motor are intact on that side. She does have a slightly antalgic gait pattern.  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   Assessment & Plan (Keaton Stirewalt L. Nikeya Maxim III PA-C; 12/21/2013 2:29 PM) Osteoarthritis of right hip, unspecified osteoarthritis type (M16.11) Note:Plan is for a Right Total Hip Replacement by Dr. Wynelle Link.  Plan is to go home.  PCP - Dr. Kelton Pillar - Patient has been seen preoperatively and felt to be stable for surgery.  The patient does  not have any contraindications and will receive TXA (tranexamic acid) prior to surgery.  Signed electronically by Joelene Millin, III PA-C

## 2014-01-10 NOTE — Transfer of Care (Signed)
Immediate Anesthesia Transfer of Care Note  Patient: Autumn Lara  Procedure(s) Performed: Procedure(s) (LRB): RIGHT TOTAL HIP ARTHROPLASTY ANTERIOR APPROACH (Right)  Patient Location: PACU  Anesthesia Type: Spinal  Level of Consciousness: sedated, patient cooperative and responds to stimulation  Airway & Oxygen Therapy: Patient Spontanous Breathing and Patient connected to face mask oxgen  Post-op Assessment: Report given to PACU RN and Post -op Vital signs reviewed and stable  Post vital signs: Reviewed and stable  Complications: No apparent anesthesia complications U-72 level on exam, denied pain.

## 2014-01-10 NOTE — Anesthesia Postprocedure Evaluation (Signed)
  Anesthesia Post-op Note  Patient: Autumn Lara  Procedure(s) Performed: Procedure(s) (LRB): RIGHT TOTAL HIP ARTHROPLASTY ANTERIOR APPROACH (Right)  Patient Location: PACU  Anesthesia Type: Spinal  Level of Consciousness: awake and alert   Airway and Oxygen Therapy: Patient Spontanous Breathing  Post-op Pain: mild  Post-op Assessment: Post-op Vital signs reviewed, Patient's Cardiovascular Status Stable, Respiratory Function Stable, Patent Airway and No signs of Nausea or vomiting  Last Vitals:  Filed Vitals:   01/10/14 1421  BP: 122/82  Pulse: 55  Temp: 36.5 C  Resp: 14    Post-op Vital Signs: stable   Complications: No apparent anesthesia complications

## 2014-01-10 NOTE — Anesthesia Procedure Notes (Signed)
Spinal Patient location during procedure: OR Start time: 01/10/2014 11:10 AM End time: 01/10/2014 11:15 AM Staffing Resident/CRNA: Sherian Maroon A Performed by: resident/CRNA and other anesthesia staff  Preanesthetic Checklist Completed: patient identified, site marked, surgical consent, pre-op evaluation, timeout performed, IV checked, risks and benefits discussed and monitors and equipment checked Spinal Block Patient position: sitting Prep: Betadine Patient monitoring: heart rate, continuous pulse ox and blood pressure Approach: midline Location: L4-5 Needle Needle type: Sprotte  Needle gauge: 24 G Needle length: 9 cm Needle insertion depth: 3 cm

## 2014-01-10 NOTE — Anesthesia Preprocedure Evaluation (Addendum)
Anesthesia Evaluation  Patient identified by MRN, date of birth, ID band Patient awake    Reviewed: Allergy & Precautions, H&P , NPO status , Patient's Chart, lab work & pertinent test results  Airway Mallampati: II  TM Distance: >3 FB Neck ROM: Full    Dental no notable dental hx.    Pulmonary former smoker,  breath sounds clear to auscultation  Pulmonary exam normal       Cardiovascular negative cardio ROS  Rhythm:Regular Rate:Normal     Neuro/Psych negative neurological ROS  negative psych ROS   GI/Hepatic negative GI ROS, Neg liver ROS,   Endo/Other  negative endocrine ROS  Renal/GU negative Renal ROS  negative genitourinary   Musculoskeletal  (+) Arthritis -,   Abdominal   Peds negative pediatric ROS (+)  Hematology negative hematology ROS (+)   Anesthesia Other Findings   Reproductive/Obstetrics negative OB ROS                            Anesthesia Physical Anesthesia Plan  ASA: II  Anesthesia Plan: Spinal   Post-op Pain Management:    Induction: Intravenous  Airway Management Planned:   Additional Equipment:   Intra-op Plan:   Post-operative Plan: Extubation in OR  Informed Consent: I have reviewed the patients History and Physical, chart, labs and discussed the procedure including the risks, benefits and alternatives for the proposed anesthesia with the patient or authorized representative who has indicated his/her understanding and acceptance.   Dental advisory given  Plan Discussed with: CRNA  Anesthesia Plan Comments: (Discussed spinal and general. Discussed risks/benefits of spinal including headache, backache, failure, bleeding, infection, and nerve damage. Patient consents to spinal. Questions answered. Coagulation studies and platelet count acceptable.)     Anesthesia Quick Evaluation

## 2014-01-10 NOTE — Plan of Care (Signed)
Problem: Phase I Progression Outcomes Goal: CMS/Neurovascular status WDL Outcome: Completed/Met Date Met:  01/10/14

## 2014-01-10 NOTE — Interval H&P Note (Signed)
History and Physical Interval Note:  01/10/2014 10:03 AM  Autumn Lara  has presented today for surgery, with the diagnosis of osteoarthritis of the right hip  The various methods of treatment have been discussed with the patient and family. After consideration of risks, benefits and other options for treatment, the patient has consented to  Procedure(s): RIGHT TOTAL HIP ARTHROPLASTY ANTERIOR APPROACH (Right) as a surgical intervention .  The patient's history has been reviewed, patient examined, no change in status, stable for surgery.  I have reviewed the patient's chart and labs.  Questions were answered to the patient's satisfaction.     Gearlean Alf

## 2014-01-10 NOTE — Op Note (Signed)
OPERATIVE REPORT  PREOPERATIVE DIAGNOSIS: Osteoarthritis of the Right hip.   POSTOPERATIVE DIAGNOSIS: Osteoarthritis of the Right  hip.   PROCEDURE: Right total hip arthroplasty, anterior approach.   SURGEON: Gaynelle Arabian, MD   ASSISTANT: Arlee Muslim, PA-C  ANESTHESIA:  Spinal  ESTIMATED BLOOD LOSS:-250 ml  DRAINS: Hemovac x1.   COMPLICATIONS: None   CONDITION: PACU - hemodynamically stable.   BRIEF CLINICAL NOTE: Autumn Lara is a 63 y.o. female who has advanced end-  stage arthritis of her Right  hip with progressively worsening pain and  dysfunction.The patient has failed nonoperative management and presents for  total hip arthroplasty.   PROCEDURE IN DETAIL: After successful administration of spinal  anesthetic, the traction boots for the [facility] bed were placed on both  feet and the patient was placed onto the [facility] bed, boots placed into the leg  holders. The Right hip was then isolated from the perineum with plastic  drapes and prepped and draped in the usual sterile fashion. ASIS and  greater trochanter were marked and a oblique incision was made, starting  at about 1 cm lateral and 2 cm distal to the ASIS and coursing towards  the anterior cortex of the femur. The skin was cut with a 10 blade  through subcutaneous tissue to the level of the fascia overlying the  tensor fascia lata muscle. The fascia was then incised in line with the  incision at the junction of the anterior third and posterior 2/3rd. The  muscle was teased off the fascia and then the interval between the TFL  and the rectus was developed. The Hohmann retractor was then placed at  the top of the femoral neck over the capsule. The vessels overlying the  capsule were cauterized and the fat on top of the capsule was removed.  A Hohmann retractor was then placed anterior underneath the rectus  femoris to give exposure to the entire anterior capsule. A T-shaped  capsulotomy was performed. The  edges were tagged and the femoral head  was identified.       Osteophytes are removed off the superior acetabulum.  The femoral neck was then cut in situ with an oscillating saw. Traction  was then applied to the left lower extremity utilizing the [facility]  traction. The femoral head was then removed. Retractors were placed  around the acetabulum and then circumferential removal of the labrum was  performed. Osteophytes were also removed. Reaming starts at 45 mm to  medialize and  Increased in 2 mm increments to 49 mm. We reamed in  approximately 40 degrees of abduction, 20 degrees anteversion. A 50 mm  pinnacle acetabular shell was then impacted in anatomic position under  fluoroscopic guidance with excellent purchase. We did not need to place  any additional dome screws. A 32 mm neutral + 4 marathon liner was then  placed into the acetabular shell.       The femoral lift was then placed along the lateral aspect of the femur  just distal to the vastus ridge. The leg was  externally rotated and capsule  was stripped off the inferior aspect of the femoral neck down to the  level of the lesser trochanter, this was done with electrocautery. The femur was lifted after this was performed. The  leg was then placed and extended in adducted position to essentially delivering the femur. We also removed the capsule superiorly and the  piriformis from the piriformis fossa to  gain excellent exposure of the  proximal femur. Rongeur was used to remove some cancellous bone to get  into the lateral portion of the proximal femur for placement of the  initial starter reamer. The starter broaches was placed  the starter broach  and was shown to go down the center of the canal. Broaching  with the  Corail system was then performed starting at size 8, coursing  Up to size 8. A size 8 had excellent torsional and rotational  and axial stability. The trial standard offset neck was then placed  with a 32 + 1 trial  head. The hip was then reduced. We confirmed that  the stem was in the canal both on AP and lateral x-rays. It also has excellent sizing. The hip was reduced with outstanding stability through full extension, full external rotation,  and then flexion in adduction internal rotation. AP pelvis was taken  and the leg lengths were measured and found to be exactly equal. Hip  was then dislocated again and the femoral head and neck removed. The  femoral broach was removed. Size 8 Corail stem with a standard offset  neck was then impacted into the femur following native anteversion. Has  excellent purchase in the canal. Excellent torsional and rotational and  axial stability. It is confirmed to be in the canal on AP and lateral  fluoroscopic views. The 32 + 1 ceramic head was placed and the hip  reduced with outstanding stability. Again AP pelvis was taken and it  confirmed that the leg lengths were equal. The wound was then copiously  irrigated with saline solution and the capsule reattached and repaired  with Ethibond suture.  20 mL of Exparel mixed with 50 mL of saline then additional 20 ml of .25% Bupivicaine injected into the capsule and into the edge of the tensor fascia lata as well as subcutaneous tissue. The fascia overlying the tensor fascia lata was  then closed with a running #1 V-Loc. Subcu was closed with interrupted  2-0 Vicryl and subcuticular running 4-0 Monocryl. Incision was cleaned  and dried. Steri-Strips and a bulky sterile dressing applied. Hemovac  drain was hooked to suction and then he was awakened and transported to  recovery in stable condition.        Please note that a surgical assistant was a medical necessity for this procedure to perform it in a safe and expeditious manner. Assistant was necessary to provide appropriate retraction of vital neurovascular structures and to prevent femoral fracture and allow for anatomic placement of the prosthesis.  Gaynelle Arabian, M.D.

## 2014-01-10 NOTE — Plan of Care (Signed)
Problem: Phase I Progression Outcomes Goal: Hemodynamically stable Outcome: Completed/Met Date Met:  01/10/14     

## 2014-01-11 ENCOUNTER — Encounter (HOSPITAL_COMMUNITY): Payer: Self-pay | Admitting: Orthopedic Surgery

## 2014-01-11 LAB — BASIC METABOLIC PANEL
ANION GAP: 9 (ref 5–15)
BUN: 19 mg/dL (ref 6–23)
CALCIUM: 8.8 mg/dL (ref 8.4–10.5)
CO2: 25 meq/L (ref 19–32)
Chloride: 107 mEq/L (ref 96–112)
Creatinine, Ser: 0.72 mg/dL (ref 0.50–1.10)
GFR calc non Af Amer: >90 mL/min (ref >90)
Glucose, Bld: 171 mg/dL — ABNORMAL HIGH (ref 70–99)
Potassium: 4.9 mEq/L (ref 3.7–5.3)
SODIUM: 141 meq/L (ref 137–147)

## 2014-01-11 LAB — CBC
HCT: 27.2 % — ABNORMAL LOW (ref 36.0–46.0)
Hemoglobin: 9.2 g/dL — ABNORMAL LOW (ref 12.0–15.0)
MCH: 30.3 pg (ref 26.0–34.0)
MCHC: 33.8 g/dL (ref 30.0–36.0)
MCV: 89.5 fL (ref 78.0–100.0)
PLATELETS: 201 10*3/uL (ref 150–400)
RBC: 3.04 MIL/uL — ABNORMAL LOW (ref 3.87–5.11)
RDW: 13.3 % (ref 11.5–15.5)
WBC: 8.2 10*3/uL (ref 4.0–10.5)

## 2014-01-11 MED ORDER — SODIUM CHLORIDE 0.9 % IV BOLUS (SEPSIS)
250.0000 mL | Freq: Once | INTRAVENOUS | Status: AC
  Administered 2014-01-11: 250 mL via INTRAVENOUS

## 2014-01-11 MED ORDER — POLYSACCHARIDE IRON COMPLEX 150 MG PO CAPS
150.0000 mg | ORAL_CAPSULE | Freq: Every day | ORAL | Status: DC
  Administered 2014-01-11 – 2014-01-12 (×2): 150 mg via ORAL
  Filled 2014-01-11 (×2): qty 1

## 2014-01-11 NOTE — Care Management Note (Addendum)
    Page 1 of 2   01/11/2014     11:47:55 AM CARE MANAGEMENT NOTE 01/11/2014  Patient:  KENLI, WALDO   Account Number:  [id number]  Date Initiated:  01/11/2014  Documentation initiated by:  [facility]  Subjective/Objective Assessment:   adm: RIGHT TOTAL HIP ARTHROPLASTY ANTERIOR APPROACH (Right)     Action/Plan:   discharge planning   Anticipated DC Date:  01/11/2014   Anticipated DC Plan:  [place]  CM consult      [facility] Choice  HOME HEALTH   Choice offered to / List presented to:  C-1 Patient   DME arranged  3-N-1  Vassie Moselle      DME agency  [place] arranged  [place]   Status of service:  Completed, signed off Medicare Important Message given?   (If response is "NO", the following Medicare IM given date fields will be blank) Date Medicare IM given:   Medicare IM given by:   Date Additional Medicare IM given:   Additional Medicare IM given by:    Discharge Disposition:  [place]  Per UR Regulation:    If discussed at Long Length of Stay Meetings, dates discussed:    Comments:  01/11/14 11:00 CM met with pt in room to offer choice of home health agency.  Pt chooses [facility] to render HHPT.  CM called [facility] DME rep, Pura Spice who states she will deliver 3n1 and rolling walker to room prior to discharge. Referral called to [facility] of [facility]  for HHPT. Address and contact information verified with pt.  No other CM needs were communicated.  Mariane Masters, BSN, [place].

## 2014-01-11 NOTE — Progress Notes (Signed)
   Subjective: 1 Day Post-Op Procedure(s) (LRB): RIGHT TOTAL HIP ARTHROPLASTY ANTERIOR APPROACH (Right) Patient reports pain as mild.   Patient seen in rounds by Dr. Wynelle Link. Patient is well, but has had some minor complaints of pain in the hip, requiring pain medications We will start therapy today.  Plan is to go Home after hospital stay.  Objective: Vital signs in last 24 hours: Temp:  [97.4 F (36.3 C)-98.7 F (37.1 C)] 98.3 F (36.8 C) (11/05 0628) Pulse Rate:  [54-81] 64 (11/05 0628) Resp:  [14-18] 16 (11/05 0628) BP: (92-122)/(57-82) 92/58 mmHg (11/05 0628) SpO2:  [94 %-100 %] 98 % (11/05 0628) Weight:  [65.772 kg (145 lb)] 65.772 kg (145 lb) (11/04 1520)  Intake/Output from previous day:  Intake/Output Summary (Last 24 hours) at 01/11/14 0745 Last data filed at 01/11/14 0629  Gross per 24 hour  Intake 4058.75 ml  Output   3050 ml  Net 1008.75 ml    Labs:  Recent Labs  01/11/14 0435  HGB 9.2*    Recent Labs  01/11/14 0435  WBC 8.2  RBC 3.04*  HCT 27.2*  PLT 201    Recent Labs  01/11/14 0435  NA 141  K 4.9  CL 107  CO2 25  BUN 19  CREATININE 0.72  GLUCOSE 171*  CALCIUM 8.8   No results for input(s): LABPT, INR in the last 72 hours.  EXAM General - Patient is Alert, Appropriate and Oriented Extremity - Neurovascular intact Sensation intact distally Dorsiflexion/Plantar flexion intact Dressing - dressing C/D/I Motor Function - intact, moving foot and toes well on exam.  Hemovac pulled without difficulty.  Past Medical History  Diagnosis Date  . Arthritis     PAIN AND OA RIGHT HIP; SOME ARTHRITIS IN HANDS  . History of kidney stones   . Shingles     NO RESIDUAL PROBLEMS  . Diverticulitis     HOSP ABOUT 6 YRS AGO ? 2010    Assessment/Plan: 1 Day Post-Op Procedure(s) (LRB): RIGHT TOTAL HIP ARTHROPLASTY ANTERIOR APPROACH (Right) Principal Problem:   OA (osteoarthritis) of hip  Estimated body mass index is 24.13 kg/(m^2) as  calculated from the following:   Height as of this encounter: 5\' 5"  (1.651 m).   Weight as of this encounter: 65.772 kg (145 lb). Advance diet Up with therapy Plan for discharge tomorrow Discharge home with home health  DVT Prophylaxis - Xarelto Weight Bearing As Tolerated right Leg Hemovac Pulled Begin Therapy  Arlee Muslim, PA-C Orthopaedic Surgery 01/11/2014, 7:45 AM

## 2014-01-11 NOTE — Discharge Instructions (Addendum)
Dr. Gaynelle Arabian Total Joint Specialist [facility] [address] [place], [place] [address] [phone]    ANTERIOR APPROACH TOTAL HIP REPLACEMENT POSTOPERATIVE DIRECTIONS   Hip Rehabilitation, Guidelines Following Surgery  The results of a hip operation are greatly improved after range of motion and muscle strengthening exercises. Follow all safety measures which are given to protect your hip. If any of these exercises cause increased pain or swelling in your joint, decrease the amount until you are comfortable again. Then slowly increase the exercises. Call your caregiver if you have problems or questions.  HOME CARE INSTRUCTIONS  Most of the following instructions are designed to prevent the dislocation of your new hip.  Remove items at home which could result in a fall. This includes throw rugs or furniture in walking pathways.  Continue medications as instructed at time of discharge.  You may have some home medications which will be placed on hold until you complete the course of blood thinner medication.  You may start showering once you are discharged home but do not submerge the incision under water. Just pat the incision dry and apply a dry gauze dressing on daily. Do not put on socks or shoes without following the instructions of your caregivers.  Sit on high chairs which makes it easier to stand.  Sit on chairs with arms. Use the chair arms to help push yourself up when arising.  Keep your leg on the side of the operation out in front of you when standing up.  Arrange for the use of a toilet seat elevator so you are not sitting low.    Walk with walker as instructed.  You may resume a sexual relationship in one month or when given the OK by your caregiver.  Use walker as long as suggested by your caregivers.  You may put full weight on your legs and walk as much as is comfortable. Avoid periods of inactivity such as sitting longer than an hour  when not asleep. This helps prevent blood clots.  You may return to work once you are cleared by Engineer, production.  Do not drive a car for 6 weeks or until released by your surgeon.  Do not drive while taking narcotics.  Wear elastic stockings for three weeks following surgery during the day but you may remove then at night.  Make sure you keep all of your appointments after your operation with all of your doctors and caregivers. You should call the office at the above phone number and make an appointment for approximately two weeks after the date of your surgery. Change the dressing daily and reapply a dry dressing each time. Please pick up a stool softener and laxative for home use as long as you are requiring pain medications.  Continue to use ice on the hip for pain and swelling from surgery. You may notice swelling that will progress down to the foot and ankle.  This is normal after surgery.  Elevate the leg when you are not up walking on it.   It is important for you to complete the blood thinner medication as prescribed by your doctor.  Continue to use the breathing machine which will help keep your temperature down.  It is common for your temperature to cycle up and down following surgery, especially at night when you are not up moving around and exerting yourself.  The breathing machine keeps your lungs expanded and your temperature down.  RANGE OF MOTION AND STRENGTHENING EXERCISES  These exercises are designed to help you keep full movement of your hip joint. Follow your caregiver's or physical therapist's instructions. Perform all exercises about fifteen times, three times per day or as directed. Exercise both hips, even if you have had only one joint replacement. These exercises can be done on a training (exercise) mat, on the floor, on a table or on a bed. Use whatever works the best and is most comfortable for you. Use music or television while you are exercising so that the exercises are a  pleasant break in your day. This will make your life better with the exercises acting as a break in routine you can look forward to.  Lying on your back, slowly slide your foot toward your buttocks, raising your knee up off the floor. Then slowly slide your foot back down until your leg is straight again.  Lying on your back spread your legs as far apart as you can without causing discomfort.  Lying on your side, raise your upper leg and foot straight up from the floor as far as is comfortable. Slowly lower the leg and repeat.  Lying on your back, tighten up the muscle in the front of your thigh (quadriceps muscles). You can do this by keeping your leg straight and trying to raise your heel off the floor. This helps strengthen the largest muscle supporting your knee.  Lying on your back, tighten up the muscles of your buttocks both with the legs straight and with the knee bent at a comfortable angle while keeping your heel on the floor.   SKILLED REHAB INSTRUCTIONS: If the patient is transferred to a skilled rehab facility following release from the hospital, a list of the current medications will be sent to the facility for the patient to continue.  When discharged from the skilled rehab facility, please have the facility set up the patient's [place] prior to being released. Also, the skilled facility will be responsible for providing the patient with their medications at time of release from the facility to include their pain medication, the muscle relaxants, and their blood thinner medication. If the patient is still at the rehab facility at time of the two week follow up appointment, the skilled rehab facility will also need to assist the patient in arranging follow up appointment in our office and any transportation needs.  MAKE SURE YOU:  Understand these instructions.  Will watch your condition.  Will get help right away if you are not doing well or get worse.  Pick up  stool softner and laxative for home. Do not submerge incision under water. May shower. Continue to use ice for pain and swelling from surgery. Total Hip Protocol.  Take Xarelto for two and a half more weeks, then discontinue Xarelto. Once the patient has completed the blood thinner regimen, then take a Baby 81 mg Aspirin daily for three more weeks.   Postoperative Constipation Protocol  Constipation - defined medically as fewer than three stools per week and severe constipation as less than one stool per week.  One of the most common issues patients have following surgery is constipation.  Even if you have a regular bowel pattern at home, your normal regimen is likely to be disrupted due to multiple reasons following surgery.  Combination of anesthesia, postoperative narcotics, change in appetite and fluid intake all can affect your bowels.  In order to avoid complications following surgery, here are some recommendations in order to help you during your  recovery period.  Colace (docusate) - Pick up an over-the-counter form of Colace or another stool softener and take twice a day as long as you are requiring postoperative pain medications.  Take with a full glass of water daily.  If you experience loose stools or diarrhea, hold the colace until you stool forms back up.  If your symptoms do not get better within 1 week or if they get worse, check with your doctor.  Dulcolax (bisacodyl) - Pick up over-the-counter and take as directed by the product packaging as needed to assist with the movement of your bowels.  Take with a full glass of water.  Use this product as needed if not relieved by Colace only.   MiraLax (polyethylene glycol) - Pick up over-the-counter to have on hand.  MiraLax is a solution that will increase the amount of water in your bowels to assist with bowel movements.  Take as directed and can mix with a glass of water, juice, soda, coffee, or tea.  Take if you go more than two days  without a movement. Do not use MiraLax more than once per day. Call your doctor if you are still constipated or irregular after using this medication for 7 days in a row.  If you continue to have problems with postoperative constipation, please contact the office for further assistance and recommendations.  If you experience "the worst abdominal pain ever" or develop nausea or vomiting, please contact the office immediatly for further recommendations for treatment.   Information on my medicine - XARELTO (Rivaroxaban)  This medication education was reviewed with me or my healthcare representative as part of my discharge preparation.  The pharmacist that spoke with me during my hospital stay was:  Absher, Julieta Bellini, RPH  Why was Xarelto prescribed for you? Xarelto was prescribed for you to reduce the risk of blood clots forming after orthopedic surgery. The medical term for these abnormal blood clots is venous thromboembolism (VTE).  What do you need to know about xarelto ? Take your Xarelto ONCE DAILY at the same time every day. You may take it either with or without food.  If you have difficulty swallowing the tablet whole, you may crush it and mix in applesauce just prior to taking your dose.  Take Xarelto exactly as prescribed by your doctor and DO NOT stop taking Xarelto without talking to the doctor who prescribed the medication.  Stopping without other VTE prevention medication to take the place of Xarelto may increase your risk of developing a clot.  After discharge, you should have regular check-up appointments with your healthcare provider that is prescribing your Xarelto.    What do you do if you miss a dose? If you miss a dose, take it as soon as you remember on the same day then continue your regularly scheduled once daily regimen the next day. Do not take two doses of Xarelto on the same day.   Important Safety Information A possible side effect of Xarelto is  bleeding. You should call your healthcare provider right away if you experience any of the following: ? Bleeding from an injury or your nose that does not stop. ? Unusual colored urine (red or dark brown) or unusual colored stools (red or black). ? Unusual bruising for unknown reasons. ? A serious fall or if you hit your head (even if there is no bleeding).  Some medicines may interact with Xarelto and might increase your risk of bleeding while on Xarelto. To help avoid  this, consult your healthcare provider or pharmacist prior to using any new prescription or non-prescription medications, including herbals, vitamins, non-steroidal anti-inflammatory drugs (NSAIDs) and supplements.  This website has more information on Xarelto: [email or web address]

## 2014-01-11 NOTE — Plan of Care (Signed)
Problem: Phase II Progression Outcomes Goal: Tolerating diet Outcome: Completed/Met Date Met:  01/11/14     

## 2014-01-11 NOTE — Plan of Care (Signed)
Problem: Phase I Progression Outcomes Goal: Other Phase I Outcomes/Goals Outcome: Completed/Met Date Met:  01/11/14     

## 2014-01-11 NOTE — Evaluation (Signed)
Occupational Therapy Evaluation Patient Details Name: Autumn Lara MRN: [id number] DOB: Jun 17, 1950 Today's Date: 01/11/2014    History of Present Illness R DA THA   Clinical Impression   This 63 year old female was admitted for the above surgery.  Will plan to see pt one more time in acute: she had catheter in place at time of evaluation and was not ready to practice shower transfer.     Follow Up Recommendations  No OT follow up    Equipment Recommendations  3 in 1 bedside comode (delivered)    Recommendations for Other Services       Precautions / Restrictions Precautions Precautions: Fall Restrictions Weight Bearing Restrictions: No Other Position/Activity Restrictions: WBAT      Mobility Bed Mobility Overal bed mobility: Needs Assistance Bed Mobility: Supine to Sit     Supine to sit: Min assist     General bed mobility comments: not tested with OT  Transfers Overall transfer level: Needs assistance Equipment used: Rolling walker (2 wheeled) Transfers: Sit to/from Stand Sit to Stand: Min assist         General transfer comment: cues for UE/LE placement    Balance                                            ADL Overall ADL's : Needs assistance/impaired             Lower Body Bathing: Minimal assistance;Sit to/from stand       Lower Body Dressing: Moderate assistance;Sit to/from stand                 General ADL Comments: Sister will stay with pt.  She may be interested in reacher, but will have help as needed.  Pt still has catheter--did not ambulate to bathroom. She is able to complete UB adls with set up.  3:1 was delivered to room:  educated on shower placement and having sister wipe legs after showering, and removing back bar, if needed, when placed over commode     Vision                     Perception     Praxis      Pertinent Vitals/Pain Pain Assessment: 0-10 Pain Score: 5  Pain Location: R  hip Pain Descriptors / Indicators: Aching;Sore Pain Intervention(s): Limited activity within patient's tolerance     Hand Dominance Right   Extremity/Trunk Assessment Upper Extremity Assessment Upper Extremity Assessment: Overall WFL for tasks assessed      Cervical / Trunk Assessment Cervical / Trunk Assessment: Normal   Communication Communication Communication: No difficulties   Cognition Arousal/Alertness: Awake/alert Behavior During Therapy: WFL for tasks assessed/performed Overall Cognitive Status: Within Functional Limits for tasks assessed                     General Comments       Exercises      Shoulder Instructions      Home Living Family/patient expects to be discharged to:: Private residence Living Arrangements: Other relatives Available Help at Discharge: Family Type of Home: House Home Access: Stairs to enter Technical brewer of Steps: 4 Entrance Stairs-Rails: Right Home Layout: One level     Bathroom Shower/Tub: Walk-in shower (new, no seat)   Biochemist, clinical: Standard     Home Equipment: None   Additional  Comments: 3:1 delivered to room      Prior Functioning/Environment Level of Independence: Independent             OT Diagnosis: Generalized weakness   OT Problem List: Decreased strength;Decreased activity tolerance;Pain;Decreased knowledge of use of DME or AE   OT Treatment/Interventions: Self-care/ADL training;DME and/or AE instruction;Patient/family education    OT Goals(Current goals can be found in the care plan section) Acute Rehab OT Goals Patient Stated Goal: Resume previous lifestyle with decreased pain OT Goal Formulation: With patient Time For Goal Achievement: 01/18/14 Potential to Achieve Goals: Good ADL Goals Pt Will Perform Grooming: with min guard assist;standing Pt Will Transfer to Toilet: with min guard assist;bedside commode;ambulating Pt Will Perform Toileting - Clothing Manipulation and  hygiene: with min guard assist;sit to/from stand Pt Will Perform Tub/Shower Transfer: Shower transfer;3 in 1;ambulating;with min guard assist Additional ADL Goal #1: pt will don pants with reacher with min guard assist  OT Frequency: Min 2X/week   Barriers to D/C:            Co-evaluation              End of Session    Activity Tolerance: Patient tolerated treatment well Patient left: in chair;with call bell/phone within reach;with family/visitor present   Time: 2263-3354 OT Time Calculation (min): 15 min Charges:  OT General Charges $OT Visit: 1 Procedure OT Evaluation $Initial OT Evaluation Tier I: 1 Procedure OT Treatments $Therapeutic Activity: 8-22 mins G-Codes:    Reyce Lubeck Jan 20, 2014, 2:51 PM Lesle Chris, OTR/L [phone] 01-20-14

## 2014-01-11 NOTE — Evaluation (Signed)
Physical Therapy Evaluation Patient Details Name: Autumn Lara MRN: [id number] DOB: 1950-03-11 Today's Date: 01/11/2014   History of Present Illness  R THR  Clinical Impression  Pt s/p R THR presents with decreased R LE strength/ROM, post op pain and dizziness with position change limiting functional mobility.  Pt should progress well to d.c home with family assist and HHPT follow up.    Follow Up Recommendations Home health PT    Equipment Recommendations  Rolling walker with 5" wheels    Recommendations for Other Services OT consult     Precautions / Restrictions Precautions Precautions: Fall Restrictions Weight Bearing Restrictions: No Other Position/Activity Restrictions: WBAT      Mobility  Bed Mobility Overal bed mobility: Needs Assistance Bed Mobility: Supine to Sit     Supine to sit: Min assist     General bed mobility comments: cues for sequence and use of L LE to self assist  Transfers Overall transfer level: Needs assistance Equipment used: Rolling walker (2 wheeled) Transfers: Sit to/from Stand Sit to Stand: +2 safety/equipment;Min assist         General transfer comment: cues for LE management and use of UEs to self assist  Ambulation/Gait Ambulation/Gait assistance: Min assist;+2 safety/equipment Ambulation Distance (Feet): 62 Feet Assistive device: Rolling walker (2 wheeled) Gait Pattern/deviations: Step-to pattern;Decreased step length - right;Decreased step length - left;Shuffle Gait velocity: decr   General Gait Details: cues for sequence, posture and position from [employer]            Wheelchair Mobility    Modified Rankin (Stroke Patients Only)       Balance                                             Pertinent Vitals/Pain Pain Assessment: 0-10 Pain Score: 4  Pain Location: R hip Pain Descriptors / Indicators: Aching;Sore Pain Intervention(s): Limited activity within patient's tolerance;Monitored  during session;Premedicated before session;Ice applied    Home Living Family/patient expects to be discharged to:: Private residence Living Arrangements: Other relatives Available Help at Discharge: Family Type of Home: House Home Access: Stairs to enter Entrance Stairs-Rails: Right Entrance Stairs-Number of Steps: 4 Home Layout: One level Home Equipment: None      Prior Function Level of Independence: Independent               Hand Dominance   Dominant Hand: Right    Extremity/Trunk Assessment   Upper Extremity Assessment: Overall WFL for tasks assessed           Lower Extremity Assessment: RLE deficits/detail RLE Deficits / Details: 3-/5 hip strength with AAROM at hip to 80 flex and 15 abd    Cervical / Trunk Assessment: Normal  Communication   Communication: No difficulties  Cognition Arousal/Alertness: Awake/alert Behavior During Therapy: WFL for tasks assessed/performed Overall Cognitive Status: Within Functional Limits for tasks assessed                      General Comments      Exercises Total Joint Exercises Ankle Circles/Pumps: AROM;Both;15 reps;Supine Quad Sets: AROM;Both;10 reps;Supine Heel Slides: AAROM;15 reps;Supine;Right Hip ABduction/ADduction: AAROM;Right;10 reps;Supine      Assessment/Plan    PT Assessment Patient needs continued PT services  PT Diagnosis Difficulty walking   PT Problem List Decreased strength;Decreased range of motion;Decreased activity tolerance;Decreased mobility;Pain;Decreased knowledge of use  of DME  PT Treatment Interventions DME instruction;Gait training;Stair training;Functional mobility training;Therapeutic activities;Therapeutic exercise;Patient/family education   PT Goals (Current goals can be found in the Care Plan section) Acute Rehab PT Goals Patient Stated Goal: Resume previous lifestyle with decreased pain PT Goal Formulation: With patient Time For Goal Achievement: 01/17/14 Potential to  Achieve Goals: Good    Frequency 7X/week   Barriers to discharge        Co-evaluation               End of Session Equipment Utilized During Treatment: Gait belt Activity Tolerance: Patient tolerated treatment well;Other (comment) (dizziness with position change) Patient left: in chair;with call bell/phone within reach;with family/visitor present Nurse Communication: Mobility status         Time: 1105-1150 PT Time Calculation (min): 45 min   Charges:   PT Evaluation $Initial PT Evaluation Tier I: 1 Procedure PT Treatments $Gait Training: 8-22 mins $Therapeutic Exercise: 8-22 mins   PT G Codes:          Lynleigh Kovack 01/11/2014, 12:59 PM

## 2014-01-11 NOTE — Plan of Care (Signed)
Problem: Phase II Progression Outcomes Goal: Ambulates Outcome: Completed/Met Date Met:  01/11/14

## 2014-01-11 NOTE — Progress Notes (Signed)
UR completed.  Rhone Ozaki, BSN, CM [phone]. 

## 2014-01-11 NOTE — Plan of Care (Signed)
Problem: Phase I Progression Outcomes Goal: Initial discharge plan identified Outcome: Completed/Met Date Met:  01/11/14

## 2014-01-11 NOTE — Plan of Care (Signed)
Problem: Phase I Progression Outcomes Goal: Dangle or out of bed evening of surgery Outcome: Completed/Met Date Met:  01/11/14

## 2014-01-11 NOTE — Progress Notes (Signed)
Physical Therapy Treatment Patient Details Name: Autumn Lara MRN: [id number] DOB: [birth year] Today's Date: 02-08-2014    History of Present Illness R DA THA    PT Comments    Marked improvement in activity tolerance with no c/o dizziness this pm  Follow Up Recommendations  Home health PT     Equipment Recommendations  Rolling walker with 5" wheels    Recommendations for Other Services OT consult     Precautions / Restrictions Precautions Precautions: Fall Restrictions Weight Bearing Restrictions: No Other Position/Activity Restrictions: WBAT    Mobility  Bed Mobility Overal bed mobility: Needs Assistance Bed Mobility: Sit to Supine       Sit to supine: Min assist   General bed mobility comments: cues for sequence and use of L LE to self assist  Transfers Overall transfer level: Needs assistance Equipment used: Rolling walker (2 wheeled) Transfers: Sit to/from Stand Sit to Stand: Min assist         General transfer comment: cues for UE/LE placement  Ambulation/Gait Ambulation/Gait assistance: Min assist Ambulation Distance (Feet): 140 Feet Assistive device: Rolling walker (2 wheeled) Gait Pattern/deviations: Step-to pattern;Decreased step length - right;Decreased step length - left;Shuffle;Trunk flexed Gait velocity: decr   General Gait Details: cues for sequence, posture and position from [employer]            Wheelchair Mobility    Modified Rankin (Stroke Patients Only)       Balance                                    Cognition Arousal/Alertness: Awake/alert Behavior During Therapy: WFL for tasks assessed/performed Overall Cognitive Status: Within Functional Limits for tasks assessed                      Exercises      General Comments        Pertinent Vitals/Pain Pain Assessment: 0-10 Pain Score: 4  Pain Location: R hip Pain Descriptors / Indicators: Aching;Burning Pain Intervention(s): Limited  activity within patient's tolerance;Monitored during session;Premedicated before session;Ice applied    Home Living Family/patient expects to be discharged to:: Private residence Living Arrangements: Other relatives Available Help at Discharge: Family           Additional Comments: 3:1 delivered to room    Prior Function Level of Independence: Independent          PT Goals (current goals can now be found in the care plan section) Acute Rehab PT Goals Patient Stated Goal: Resume previous lifestyle with decreased pain PT Goal Formulation: With patient Time For Goal Achievement: 01/17/14 Potential to Achieve Goals: Good Progress towards PT goals: Progressing toward goals    Frequency  7X/week    PT Plan Current plan remains appropriate    Co-evaluation             End of Session Equipment Utilized During Treatment: Gait belt Activity Tolerance: Patient tolerated treatment well Patient left: in bed;with call bell/phone within reach;with family/visitor present     Time: 3299-2426 PT Time Calculation (min): 29 min  Charges:  $Gait Training: 23-37 mins                    G Codes:      Elienai Gailey Feb 08, 2014, 4:50 PM

## 2014-01-11 NOTE — Progress Notes (Signed)
[facility]   [facility] is providing the following services: RW and [facility]  If patient discharges after hours, please call [phone].   Linward Headland 01/11/2014, 3:14 PM

## 2014-01-11 NOTE — Plan of Care (Signed)
Problem: Phase I Progression Outcomes Goal: Pain controlled with appropriate interventions Outcome: Completed/Met Date Met:  01/11/14

## 2014-01-12 LAB — BASIC METABOLIC PANEL
Anion gap: 10 (ref 5–15)
BUN: 12 mg/dL (ref 6–23)
CHLORIDE: 105 meq/L (ref 96–112)
CO2: 27 mEq/L (ref 19–32)
Calcium: 9 mg/dL (ref 8.4–10.5)
Creatinine, Ser: 0.55 mg/dL (ref 0.50–1.10)
Glucose, Bld: 130 mg/dL — ABNORMAL HIGH (ref 70–99)
POTASSIUM: 4.3 meq/L (ref 3.7–5.3)
SODIUM: 142 meq/L (ref 137–147)

## 2014-01-12 LAB — CBC
HEMATOCRIT: 26.8 % — AB (ref 36.0–46.0)
Hemoglobin: 8.9 g/dL — ABNORMAL LOW (ref 12.0–15.0)
MCH: 30 pg (ref 26.0–34.0)
MCHC: 33.2 g/dL (ref 30.0–36.0)
MCV: 90.2 fL (ref 78.0–100.0)
Platelets: 208 10*3/uL (ref 150–400)
RBC: 2.97 MIL/uL — ABNORMAL LOW (ref 3.87–5.11)
RDW: 13.7 % (ref 11.5–15.5)
WBC: 8.2 10*3/uL (ref 4.0–10.5)

## 2014-01-12 MED ORDER — OXYCODONE HCL 5 MG PO TABS: 5.0000 mg | ORAL_TABLET | ORAL | Status: AC | PRN

## 2014-01-12 MED ORDER — RIVAROXABAN 10 MG PO TABS: 10.0000 mg | ORAL_TABLET | Freq: Every day | ORAL | Status: AC

## 2014-01-12 MED ORDER — TRAMADOL HCL 50 MG PO TABS: 50.0000 mg | ORAL_TABLET | Freq: Four times a day (QID) | ORAL | Status: AC | PRN

## 2014-01-12 MED ORDER — POLYSACCHARIDE IRON COMPLEX 150 MG PO CAPS: 150.0000 mg | ORAL_CAPSULE | Freq: Two times a day (BID) | ORAL | Status: AC

## 2014-01-12 MED ORDER — METHOCARBAMOL 500 MG PO TABS: 500.0000 mg | ORAL_TABLET | Freq: Four times a day (QID) | ORAL | Status: AC | PRN

## 2014-01-12 NOTE — Progress Notes (Signed)
RN reviewed discharge instructions with patient and family. All questions answered.   Paperwork and prescriptions given.   RN and NT rolled patient down in wheelchair to family car.

## 2014-01-12 NOTE — Progress Notes (Signed)
Occupational Therapy Treatment Patient Details Name: Autumn Lara MRN: [id number] DOB: [birth year] Today's Date: 01/12/2014    History of present illness R DA THA   OT comments  Goals met in acute.  Pt/sister do not have any further questions for OT  Follow Up Recommendations  No OT follow up    Equipment Recommendations  3 in 1 bedside comode    Recommendations for Other Services      Precautions / Restrictions Precautions Precautions: Fall Restrictions Weight Bearing Restrictions: No Other Position/Activity Restrictions: WBAT       Mobility Bed Mobility                  Transfers   Equipment used: Rolling walker (2 wheeled) Transfers: Sit to/from Stand Sit to Stand: Min guard         General transfer comment: for safety    Balance                                   ADL Overall ADL's : Needs assistance/impaired     Grooming: Wash/dry hands;Supervision/safety;Standing                   Toilet Transfer: Min guard;Comfort height toilet;BSC   Toileting- Water quality scientist and Hygiene: Min guard;Sit to/from stand   Tub/ Shower Transfer: Min guard;Walk-in shower;Ambulation     General ADL Comments: pt not interested in reacher:  family will assist as needed      Vision                     Perception     Praxis      Cognition   Behavior During Therapy: WFL for tasks assessed/performed Overall Cognitive Status: Within Functional Limits for tasks assessed                       Extremity/Trunk Assessment               Exercises     Shoulder Instructions       General Comments      Pertinent Vitals/ Pain       Pain Score: 2  Pain Location: R hip Pain Descriptors / Indicators: Sore Pain Intervention(s): Limited activity within patient's tolerance;Monitored during session;Premedicated before session;Ice applied  Home Living                                           Prior Functioning/Environment              Frequency       Progress Toward Goals  OT Goals(current goals can now be found in the care plan section)  Progress towards OT goals: Goals met/education completed, patient discharged from [place] of Session     Activity Tolerance Patient tolerated treatment well   Patient Left in chair;with call bell/phone within reach;with family/visitor present   Nurse Communication          Time: 6387-5643 OT Time Calculation (min): 14 min  Charges: OT General Charges $OT Visit: 1 Procedure OT Treatments $Self Care/Home Management : 8-22 mins  Autumn Lara 01/12/2014, 11:13 AM Lesle Chris,  OTR/L [phone] 01/12/2014

## 2014-01-12 NOTE — Progress Notes (Signed)
Physical Therapy Treatment Patient Details Name: Autumn Lara MRN: [id number] DOB: 22-Sep-1950 Today's Date: 01/12/2014    History of Present Illness R DA THA    PT Comments    Reviewed stairs and car transfers with pt and sister.  Follow Up Recommendations  Home health PT     Equipment Recommendations  Rolling walker with 5" wheels    Recommendations for Other Services OT consult     Precautions / Restrictions Precautions Precautions: Fall Restrictions Weight Bearing Restrictions: No Other Position/Activity Restrictions: WBAT    Mobility  Bed Mobility Overal bed mobility: Needs Assistance Bed Mobility: Supine to Sit;Sit to Supine     Supine to sit: Supervision Sit to supine: Supervision   General bed mobility comments: cues for sequence and use of L LE to self assist  Transfers Overall transfer level: Needs assistance Equipment used: Rolling walker (2 wheeled) Transfers: Sit to/from Stand Sit to Stand: Supervision         General transfer comment: cues for use of UEs to self assist  Ambulation/Gait Ambulation/Gait assistance: Min guard;Supervision Ambulation Distance (Feet): 180 Feet Assistive device: Rolling walker (2 wheeled) Gait Pattern/deviations: Step-to pattern;Step-through pattern;Decreased step length - right;Decreased step length - left;Shuffle;Trunk flexed Gait velocity: decr   General Gait Details: cues for sequence, posture and position from RW   Stairs Stairs: Yes Stairs assistance: Min assist Stair Management: One rail Right;Step to pattern;With crutches Number of Stairs: 7 General stair comments: cues for sequence and for crutch/foot placement.  5 step with crutch and rail and 2 step with HHA and rail  Wheelchair Mobility    Modified Rankin (Stroke Patients Only)       Balance                                    Cognition Arousal/Alertness: Awake/alert Behavior During Therapy: WFL for tasks  assessed/performed Overall Cognitive Status: Within Functional Limits for tasks assessed                      Exercises      General Comments        Pertinent Vitals/Pain Pain Assessment: 0-10 Pain Score: 4  Pain Location: R hip Pain Descriptors / Indicators: Sore Pain Intervention(s): Limited activity within patient's tolerance;Monitored during session;Premedicated before session;Ice applied    Home Living                      Prior Function            PT Goals (current goals can now be found in the care plan section) Acute Rehab PT Goals Patient Stated Goal: Resume previous lifestyle with decreased pain PT Goal Formulation: With patient Time For Goal Achievement: 01/17/14 Potential to Achieve Goals: Good Progress towards PT goals: Progressing toward goals    Frequency  7X/week    PT Plan Current plan remains appropriate    Co-evaluation             End of Session Equipment Utilized During Treatment: Gait belt Activity Tolerance: Patient tolerated treatment well Patient left: in bed;with call bell/phone within reach;with family/visitor present     Time: 1325-1355 PT Time Calculation (min): 30 min  Charges:  $Gait Training: 8-22 mins $Therapeutic Activity: 8-22 mins                    G Codes:  Autumn Lara 01/12/2014, 3:08 PM

## 2014-01-12 NOTE — Progress Notes (Signed)
   Subjective: 2 Days Post-Op Procedure(s) (LRB): RIGHT TOTAL HIP ARTHROPLASTY ANTERIOR APPROACH (Right) Patient reports pain as mild.   Patient seen in rounds with Dr. Wynelle Link.  Husband in room. Patient is well, but has had some minor complaints of pain in the hip, requiring pain medications Patient is ready to go home if does well today.  Objective: Vital signs in last 24 hours: Temp:  [97.8 F (36.6 C)-98.8 F (37.1 C)] 98.7 F (37.1 C) (11/06 0457) Pulse Rate:  [65-80] 66 (11/06 0457) Resp:  [16-18] 16 (11/06 0457) BP: (99-117)/(54-72) 113/63 mmHg (11/06 0457) SpO2:  [98 %-99 %] 99 % (11/06 0457)  Intake/Output from previous day:  Intake/Output Summary (Last 24 hours) at 01/12/14 0723 Last data filed at 01/11/14 2118  Gross per 24 hour  Intake 2019.25 ml  Output   3600 ml  Net -1580.75 ml     Labs:  Recent Labs  01/11/14 0435 01/12/14 0500  HGB 9.2* 8.9*    Recent Labs  01/11/14 0435 01/12/14 0500  WBC 8.2 8.2  RBC 3.04* 2.97*  HCT 27.2* 26.8*  PLT 201 208    Recent Labs  01/11/14 0435 01/12/14 0500  NA 141 142  K 4.9 4.3  CL 107 105  CO2 25 27  BUN 19 12  CREATININE 0.72 0.55  GLUCOSE 171* 130*  CALCIUM 8.8 9.0   No results for input(s): LABPT, INR in the last 72 hours.  EXAM: General - Patient is Alert, Appropriate and Oriented Extremity - Neurovascular intact Sensation intact distally Dorsiflexion/Plantar flexion intact Incision - clean, dry, no drainage Motor Function - intact, moving foot and toes well on exam.   Assessment/Plan: 2 Days Post-Op Procedure(s) (LRB): RIGHT TOTAL HIP ARTHROPLASTY ANTERIOR APPROACH (Right) Procedure(s) (LRB): RIGHT TOTAL HIP ARTHROPLASTY ANTERIOR APPROACH (Right) Past Medical History  Diagnosis Date  . Arthritis     PAIN AND OA RIGHT HIP; SOME ARTHRITIS IN HANDS  . History of kidney stones   . Shingles     NO RESIDUAL PROBLEMS  . Diverticulitis     HOSP ABOUT 6 YRS AGO ? 2010   Principal  Problem:   OA (osteoarthritis) of hip  Estimated body mass index is 24.13 kg/(m^2) as calculated from the following:   Height as of this encounter: 5\' 5"  (1.651 m).   Weight as of this encounter: 65.772 kg (145 lb). Up with therapy Discharge home with home health Diet - Regular diet Follow up - in 2 weeks Activity - WBAT Disposition - Home Condition Upon Discharge - Good D/C Meds - See DC Summary DVT Prophylaxis - Xarelto  Arlee Muslim, PA-C Orthopaedic Surgery 01/12/2014, 7:23 AM

## 2014-01-12 NOTE — Progress Notes (Signed)
Physical Therapy Treatment Patient Details Name: Autumn Lara MRN: [id number] DOB: 1950/04/23 Today's Date: 01/12/2014    History of Present Illness R DA THA    PT Comments    Progressing well and eager for d/c this pm  Follow Up Recommendations  Home health PT     Equipment Recommendations  Rolling walker with 5" wheels    Recommendations for Other Services OT consult     Precautions / Restrictions Precautions Precautions: Fall Restrictions Weight Bearing Restrictions: No Other Position/Activity Restrictions: WBAT    Mobility  Bed Mobility Overal bed mobility: Needs Assistance Bed Mobility: Supine to Sit     Supine to sit: Min guard     General bed mobility comments: cues for sequence and use of L LE to self assist  Transfers Overall transfer level: Needs assistance Equipment used: Rolling walker (2 wheeled) Transfers: Sit to/from Stand Sit to Stand: Min guard         General transfer comment: for safety  Ambulation/Gait Ambulation/Gait assistance: Min guard Ambulation Distance (Feet): 400 Feet Assistive device: Rolling walker (2 wheeled) Gait Pattern/deviations: Step-to pattern;Decreased step length - right;Decreased step length - left;Shuffle     General Gait Details: cues for sequence, posture and position from RW   Stairs Stairs: Yes Stairs assistance: Min assist Stair Management: Two rails;Step to pattern;One rail Right Number of Stairs: 3 General stair comments: cues for sequence and foot placement; Attempted stairs with single rail and HHA - pt requiring significant support on non-rail side.  Will attempt crutch this pm  Wheelchair Mobility    Modified Rankin (Stroke Patients Only)       Balance                                    Cognition Arousal/Alertness: Awake/alert Behavior During Therapy: WFL for tasks assessed/performed Overall Cognitive Status: Within Functional Limits for tasks assessed                       Exercises Total Joint Exercises Ankle Circles/Pumps: AROM;Both;15 reps;Supine Quad Sets: AROM;Both;10 reps;Supine Gluteal Sets: AROM;Both;10 reps;Supine Heel Slides: AAROM;Supine;Right;20 reps Hip ABduction/ADduction: AAROM;Right;Supine;15 reps    General Comments        Pertinent Vitals/Pain Pain Assessment: 0-10 Pain Score: 2  Pain Location: R hip Pain Descriptors / Indicators: Sore Pain Intervention(s): Limited activity within patient's tolerance;Monitored during session;Premedicated before session;Ice applied    Home Living                      Prior Function            PT Goals (current goals can now be found in the care plan section) Acute Rehab PT Goals Patient Stated Goal: Resume previous lifestyle with decreased pain PT Goal Formulation: With patient Time For Goal Achievement: 01/17/14 Potential to Achieve Goals: Good Progress towards PT goals: Progressing toward goals    Frequency  7X/week    PT Plan Current plan remains appropriate    Co-evaluation             End of Session Equipment Utilized During Treatment: Gait belt Activity Tolerance: Patient tolerated treatment well Patient left: in chair;with call bell/phone within reach     Time: 0940-1018 PT Time Calculation (min): 38 min  Charges:  $Gait Training: 8-22 mins $Therapeutic Exercise: 8-22 mins $Therapeutic Activity: 8-22 mins  G Codes:      Autumn Lara 2014-01-13, 12:40 PM

## 2014-01-12 NOTE — Discharge Summary (Signed)
Physician Discharge Summary   Patient ID: Autumn Lara MRN: [id number] DOB/AGE: November 27, 1950 63 y.o.  Admit date: 01/10/2014 Discharge date: 01-12-2014  Primary Diagnosis:  Osteoarthritis of the Right hip.  Admission Diagnoses:  Past Medical History  Diagnosis Date  . Arthritis     PAIN AND OA RIGHT HIP; SOME ARTHRITIS IN HANDS  . History of kidney stones   . Shingles     NO RESIDUAL PROBLEMS  . Diverticulitis     HOSP ABOUT 6 YRS AGO ? 2010   Discharge Diagnoses:   Principal Problem:   OA (osteoarthritis) of hip  Estimated body mass index is 24.13 kg/(m^2) as calculated from the following:   Height as of this encounter: _0  (1.651 m).   Weight as of this encounter: 65.772 kg (145 lb).  Procedure(s) (LRB): RIGHT TOTAL HIP ARTHROPLASTY ANTERIOR APPROACH (Right)   Consults: None  HPI: Autumn Lara is a 63 y.o. female who has advanced end-  stage arthritis of her Right hip with progressively worsening pain and  dysfunction.The patient has failed nonoperative management and presents for  total hip arthroplasty.  Laboratory Data: Admission on 01/10/2014, Discharged on 01/12/2014  Component Date Value Ref Range Status  . WBC 01/11/2014 8.2  4.0 - 10.5 K/uL Final  . RBC 01/11/2014 3.04* 3.87 - 5.11 MIL/uL Final  . Hemoglobin 01/11/2014 9.2* 12.0 - 15.0 g/dL Final  . HCT 01/11/2014 27.2* 36.0 - 46.0 % Final  . MCV 01/11/2014 89.5  78.0 - 100.0 fL Final  . MCH 01/11/2014 30.3  26.0 - 34.0 pg Final  . MCHC 01/11/2014 33.8  30.0 - 36.0 g/dL Final  . RDW 01/11/2014 13.3  11.5 - 15.5 % Final  . Platelets 01/11/2014 201  150 - 400 K/uL Final  . Sodium 01/11/2014 141  137 - 147 mEq/L Final  . Potassium 01/11/2014 4.9  3.7 - 5.3 mEq/L Final  . Chloride 01/11/2014 107  96 - 112 mEq/L Final  . CO2 01/11/2014 25  19 - 32 mEq/L Final  . Glucose, Bld 01/11/2014 171* 70 - 99 mg/dL Final  . BUN 01/11/2014 19  6 - 23 mg/dL Final  . Creatinine, Ser 01/11/2014 0.72  0.50 - 1.10 mg/dL  Final  . Calcium 01/11/2014 8.8  8.4 - 10.5 mg/dL Final  . GFR calc non Af Amer 01/11/2014 >90  >90 mL/min Final  . GFR calc Af Amer 01/11/2014 >90  >90 mL/min Final   Comment: (NOTE) The eGFR has been calculated using the CKD EPI equation. This calculation has not been validated in all clinical situations. eGFR's persistently <90 mL/min signify possible Chronic Kidney Disease.   . Anion gap 01/11/2014 9  5 - 15 Final  . WBC 01/12/2014 8.2  4.0 - 10.5 K/uL Final  . RBC 01/12/2014 2.97* 3.87 - 5.11 MIL/uL Final  . Hemoglobin 01/12/2014 8.9* 12.0 - 15.0 g/dL Final  . HCT 01/12/2014 26.8* 36.0 - 46.0 % Final  . MCV 01/12/2014 90.2  78.0 - 100.0 fL Final  . MCH 01/12/2014 30.0  26.0 - 34.0 pg Final  . MCHC 01/12/2014 33.2  30.0 - 36.0 g/dL Final  . RDW 01/12/2014 13.7  11.5 - 15.5 % Final  . Platelets 01/12/2014 208  150 - 400 K/uL Final  . Sodium 01/12/2014 142  137 - 147 mEq/L Final  . Potassium 01/12/2014 4.3  3.7 - 5.3 mEq/L Final  . Chloride 01/12/2014 105  96 - 112 mEq/L Final  . CO2 01/12/2014 27  19 - 32  mEq/L Final  . Glucose, Bld 01/12/2014 130* 70 - 99 mg/dL Final  . BUN 01/12/2014 12  6 - 23 mg/dL Final  . Creatinine, Ser 01/12/2014 0.55  0.50 - 1.10 mg/dL Final  . Calcium 01/12/2014 9.0  8.4 - 10.5 mg/dL Final  . GFR calc non Af Amer 01/12/2014 >90  >90 mL/min Final  . GFR calc Af Amer 01/12/2014 >90  >90 mL/min Final   Comment: (NOTE) The eGFR has been calculated using the CKD EPI equation. This calculation has not been validated in all clinical situations. eGFR's persistently <90 mL/min signify possible Chronic Kidney Disease.   Georgiann Hahn gap 01/12/2014 10  5 - 15 Final  Hospital Outpatient Visit on 01/02/2014  Component Date Value Ref Range Status  . MRSA, PCR 01/02/2014 NEGATIVE  NEGATIVE Final  . Staphylococcus aureus 01/02/2014 POSITIVE* NEGATIVE Final   Comment:                                 The Xpert SA Assay (FDA                          approved for NASAL  specimens                          in patients over 67 years of age),                          is one component of                          a comprehensive surveillance                          program.  Test performance has                          been validated by [employer] for patients greater                          than or equal to 75 year old.                          It is not intended                          to diagnose infection nor to                          guide or monitor treatment.  Marland Kitchen aPTT 01/02/2014 29  24 - 37 seconds Final  . WBC 01/02/2014 4.4  4.0 - 10.5 K/uL Final  . RBC 01/02/2014 4.12  3.87 - 5.11 MIL/uL Final  . Hemoglobin 01/02/2014 12.2  12.0 - 15.0 g/dL Final  . HCT 01/02/2014 36.6  36.0 - 46.0 % Final  . MCV 01/02/2014 88.8  78.0 - 100.0 fL Final  . MCH 01/02/2014 29.6  26.0 - 34.0 pg Final  . MCHC 01/02/2014 33.3  30.0 - 36.0  g/dL Final  . RDW 01/02/2014 13.3  11.5 - 15.5 % Final  . Platelets 01/02/2014 293  150 - 400 K/uL Final  . Sodium 01/02/2014 139  137 - 147 mEq/L Final  . Potassium 01/02/2014 5.0  3.7 - 5.3 mEq/L Final  . Chloride 01/02/2014 100  96 - 112 mEq/L Final  . CO2 01/02/2014 27  19 - 32 mEq/L Final  . Glucose, Bld 01/02/2014 93  70 - 99 mg/dL Final  . BUN 01/02/2014 17  6 - 23 mg/dL Final  . Creatinine, Ser 01/02/2014 0.70  0.50 - 1.10 mg/dL Final  . Calcium 01/02/2014 9.8  8.4 - 10.5 mg/dL Final  . Total Protein 01/02/2014 7.4  6.0 - 8.3 g/dL Final  . Albumin 01/02/2014 4.5  3.5 - 5.2 g/dL Final  . AST 01/02/2014 23  0 - 37 U/L Final  . ALT 01/02/2014 18  0 - 35 U/L Final  . Alkaline Phosphatase 01/02/2014 66  39 - 117 U/L Final  . Total Bilirubin 01/02/2014 0.4  0.3 - 1.2 mg/dL Final  . GFR calc non Af Amer 01/02/2014 >90  >90 mL/min Final  . GFR calc Af Amer 01/02/2014 >90  >90 mL/min Final   Comment: (NOTE)                          The eGFR has been calculated using the CKD EPI equation.                           This calculation has not been validated in all clinical situations.                          eGFR's persistently <90 mL/min signify possible Chronic Kidney                          Disease.  . Anion gap 01/02/2014 12  5 - 15 Final  . Prothrombin Time 01/02/2014 13.8  11.6 - 15.2 seconds Final  . INR 01/02/2014 1.05  0.00 - 1.49 Final  . ABO/RH(D) 01/02/2014 A POS   Final  . Antibody Screen 01/02/2014 NEG   Final  . Sample Expiration 01/02/2014 01/13/2014   Final  . Color, Urine 01/02/2014 YELLOW  YELLOW Final  . APPearance 01/02/2014 CLEAR  CLEAR Final  . Specific Gravity, Urine 01/02/2014 1.006  1.005 - 1.030 Final  . pH 01/02/2014 6.0  5.0 - 8.0 Final  . Glucose, UA 01/02/2014 NEGATIVE  NEGATIVE mg/dL Final  . Hgb urine dipstick 01/02/2014 NEGATIVE  NEGATIVE Final  . Bilirubin Urine 01/02/2014 NEGATIVE  NEGATIVE Final  . Ketones, ur 01/02/2014 NEGATIVE  NEGATIVE mg/dL Final  . Protein, ur 01/02/2014 NEGATIVE  NEGATIVE mg/dL Final  . Urobilinogen, UA 01/02/2014 0.2  0.0 - 1.0 mg/dL Final  . Nitrite 01/02/2014 NEGATIVE  NEGATIVE Final  . Leukocytes, UA 01/02/2014 NEGATIVE  NEGATIVE Final   MICROSCOPIC NOT DONE ON URINES WITH NEGATIVE PROTEIN, BLOOD, LEUKOCYTES, NITRITE, OR GLUCOSE <1000 mg/dL.  . ABO/RH(D) 01/02/2014 A POS   Final     X-Rays:Dg Hip Complete Right  01/02/2014   CLINICAL DATA:  Preoperative evaluation for RIGHT hip surgery  EXAM: RIGHT HIP - COMPLETE 2+ VIEW  COMPARISON:  None  FINDINGS: Osseous demineralization.  Advanced osteoarthritic changes of RIGHT hip joint with joint space narrowing and bone-on-bone appearance, spur formation and  subchondral cyst formation.  Minimal narrowing of the LEFT hip joint.  SI joints symmetric.  No acute fracture or dislocation.  IMPRESSION: Advanced osteoarthritic changes of the RIGHT hip joint.   Electronically Signed   By: Lavonia Dana M.D.   On: 01/02/2014 17:24   Dg Pelvis Portable  01/10/2014   CLINICAL DATA:  Post right  hip replacement.  EXAM: PORTABLE PELVIS 1-2 VIEWS  COMPARISON:  01/02/2014  FINDINGS: The right total hip arthroplasty is intact and normally located. Surgical drain is present over the adjacent lateral soft tissues. There are mild degenerative changes of the left hip.  IMPRESSION: Postoperative changes compatible with recent right total hip arthroplasty.   Electronically Signed   By: Marin Olp M.D.   On: 01/10/2014 13:32   Dg C-arm 1-60 Min-no Report  01/17/2014   : Fluoroscopy was utilized by the requesting physician. No radiographic interpretation.   Electronically Signed   By: Porfirio Mylar   On: 01/17/2014 19:15    EKG:No orders found for this or any previous visit.   Hospital Course: Patient was admitted to [facility] and taken to the OR and underwent the above state procedure without complications.  Patient tolerated the procedure well and was later transferred to the recovery room and then to the orthopaedic floor for postoperative care.  They were given PO and IV analgesics for pain control following their surgery.  They were given 24 hours of postoperative antibiotics of  Anti-infectives    Start     Dose/Rate Route Frequency Ordered Stop   01/10/14 1730  ceFAZolin (ANCEF) IVPB 2 g/50 mL premix     2 g100 mL/hr over 30 Minutes Intravenous Every 6 hours 01/10/14 1433 01/11/14 0130   01/10/14 0850  ceFAZolin (ANCEF) IVPB 2 g/50 mL premix     2 g100 mL/hr over 30 Minutes Intravenous On call to O.R. 01/10/14 0850 01/10/14 1115     and started on DVT prophylaxis in the form of Xarelto.   PT and OT were ordered for total hip protocol.  The patient was allowed to be WBAT with therapy. Discharge planning was consulted to help with postop disposition and equipment needs.  Patient had a decent night on the evening of surgery.  They started to get up OOB with therapy on day one.  Hemovac drain was pulled without difficulty.  Continued to work with therapy into day two.  Dressing was  changed on day two and the incision was healing well.  Patient was seen in rounds and was ready to go home.  Discharge home with home health Diet - Regular diet Follow up - in 2 weeks Activity - WBAT Disposition - Home Condition Upon Discharge - Good D/C Meds - See DC Summary DVT Prophylaxis - Xarelto      Discharge Instructions    Call MD / Call 911    Complete by:  As directed   If you experience chest pain or shortness of breath, CALL 911 and be transported to the hospital emergency room.  If you develope a fever above 101 F, pus (white drainage) or increased drainage or redness at the wound, or calf pain, call your surgeon's office.     Change dressing    Complete by:  As directed   You may change your dressing dressing daily with sterile 4 x 4 inch gauze dressing and paper tape.  Do not submerge the incision under water.     Constipation Prevention    Complete  by:  As directed   Drink plenty of fluids.  Prune juice may be helpful.  You may use a stool softener, such as Colace (over the counter) 100 mg twice a day.  Use MiraLax (over the counter) for constipation as needed.     Diet general    Complete by:  As directed      Discharge instructions    Complete by:  As directed   Pick up stool softner and laxative for home. Do not submerge incision under water. May shower. Continue to use ice for pain and swelling from surgery.  Total Hip Protocol.  Take Xarelto for two and a half more weeks, then discontinue Xarelto. Once the patient has completed the blood thinner regimen, then take a Baby 81 mg Aspirin daily for three more weeks.     Do not sit on low chairs, stoools or toilet seats, as it may be difficult to get up from low surfaces    Complete by:  As directed      Driving restrictions    Complete by:  As directed   No driving until released by the physician.     Increase activity slowly as tolerated    Complete by:  As directed      Lifting restrictions    Complete  by:  As directed   No lifting until released by the physician.     Patient may shower    Complete by:  As directed   You may shower without a dressing once there is no drainage.  Do not wash over the wound.  If drainage remains, do not shower until drainage stops.     TED hose    Complete by:  As directed   Use stockings (TED hose) for 3 weeks on both leg(s).  You may remove them at night for sleeping.     Weight bearing as tolerated    Complete by:  As directed             Medication List    STOP taking these medications        ALEVE 220 MG Caps  Generic drug:  Naproxen Sodium      TAKE these medications        iron polysaccharides 150 MG capsule  Commonly known as:  NIFEREX  Take 1 capsule (150 mg total) by mouth 2 (two) times daily.     methocarbamol 500 MG tablet  Commonly known as:  ROBAXIN  Take 1 tablet (500 mg total) by mouth every 6 (six) hours as needed for muscle spasms.     oxyCODONE 5 MG immediate release tablet  Commonly known as:  Oxy IR/ROXICODONE  Take 1-2 tablets (5-10 mg total) by mouth every 3 (three) hours as needed for moderate pain, severe pain or breakthrough pain.     polyvinyl alcohol 1.4 % ophthalmic solution  Commonly known as:  LIQUIFILM TEARS  Place 1 drop into both eyes daily as needed for dry eyes (contacts.).     rivaroxaban 10 MG Tabs tablet  Commonly known as:  XARELTO  - Take 1 tablet (10 mg total) by mouth daily with breakfast. Take Xarelto for two and a half more weeks, then discontinue Xarelto.  - Once the patient has completed the blood thinner regimen, then take a Baby 81 mg Aspirin daily for three more weeks.     traMADol 50 MG tablet  Commonly known as:  ULTRAM  Take 1-2 tablets (50-100 mg total) by mouth  every 6 (six) hours as needed (mild pain).       Follow-up Information    Follow up with [facility].   Why:  home health physical therapy   Contact information:   [address] [place]  [address] [phone]       Follow up with [facility].   Why:  rolling walker and 3n1 (commode)   Contact information:   [place] [address] [phone]       Follow up with Gearlean Alf, MD. Schedule an appointment as soon as possible for a visit on 01/23/2014.   Specialty:  Orthopedic Surgery   Why:  Call office at [phone] to setup appointment on Tuesday 01/23/2014 with Dr. Wynelle Link.   Contact information:   [address] [place] [address] [phone]       Signed: Arlee Muslim, PA-C Orthopaedic Surgery 01/25/2014, 9:23 AM

## 2016-02-04 ENCOUNTER — Other Ambulatory Visit: Payer: Self-pay | Admitting: Internal Medicine

## 2016-02-04 ENCOUNTER — Other Ambulatory Visit (HOSPITAL_COMMUNITY)
Admission: RE | Admit: 2016-02-04 | Discharge: 2016-02-04 | Disposition: A | Discharge status: Home / Self Care | Payer: PRIVATE HEALTH INSURANCE | Source: Ambulatory Visit | Attending: Internal Medicine | Admitting: Internal Medicine

## 2016-02-04 DIAGNOSIS — Z01419 Encounter for gynecological examination (general) (routine) without abnormal findings: Secondary | ICD-10-CM | POA: Insufficient documentation

## 2016-02-04 DIAGNOSIS — Z1151 Encounter for screening for human papillomavirus (HPV): Secondary | ICD-10-CM | POA: Diagnosis present

## 2016-02-07 LAB — CYTOLOGY - PAP
Diagnosis: NEGATIVE
HPV: NOT DETECTED

## 2017-10-18 DIAGNOSIS — J209 Acute bronchitis, unspecified: Secondary | ICD-10-CM | POA: Diagnosis not present

## 2017-11-01 ENCOUNTER — Other Ambulatory Visit (HOSPITAL_COMMUNITY)
Admission: RE | Admit: 2017-11-01 | Discharge: 2017-11-01 | Disposition: A | Discharge status: Home / Self Care | Payer: Medicare Other | Source: Ambulatory Visit | Attending: Internal Medicine | Admitting: Internal Medicine

## 2017-11-01 ENCOUNTER — Other Ambulatory Visit: Payer: Self-pay | Admitting: Internal Medicine

## 2017-11-01 DIAGNOSIS — M160 Bilateral primary osteoarthritis of hip: Secondary | ICD-10-CM | POA: Diagnosis not present

## 2017-11-01 DIAGNOSIS — Z23 Encounter for immunization: Secondary | ICD-10-CM | POA: Diagnosis not present

## 2017-11-01 DIAGNOSIS — Z0000 Encounter for general adult medical examination without abnormal findings: Secondary | ICD-10-CM | POA: Diagnosis not present

## 2017-11-01 DIAGNOSIS — Z1239 Encounter for other screening for malignant neoplasm of breast: Secondary | ICD-10-CM | POA: Diagnosis not present

## 2017-11-01 DIAGNOSIS — Z1389 Encounter for screening for other disorder: Secondary | ICD-10-CM | POA: Diagnosis not present

## 2017-11-01 DIAGNOSIS — E785 Hyperlipidemia, unspecified: Secondary | ICD-10-CM | POA: Diagnosis not present

## 2017-11-01 DIAGNOSIS — Z01419 Encounter for gynecological examination (general) (routine) without abnormal findings: Secondary | ICD-10-CM | POA: Diagnosis not present

## 2017-11-01 DIAGNOSIS — Z96642 Presence of left artificial hip joint: Secondary | ICD-10-CM | POA: Diagnosis not present

## 2017-11-01 DIAGNOSIS — M810 Age-related osteoporosis without current pathological fracture: Secondary | ICD-10-CM | POA: Diagnosis not present

## 2017-11-01 DIAGNOSIS — Z1211 Encounter for screening for malignant neoplasm of colon: Secondary | ICD-10-CM | POA: Diagnosis not present

## 2017-11-02 LAB — CYTOLOGY - PAP: Diagnosis: NEGATIVE

## 2017-11-24 DIAGNOSIS — R1031 Right lower quadrant pain: Secondary | ICD-10-CM | POA: Diagnosis not present

## 2017-11-24 DIAGNOSIS — R509 Fever, unspecified: Secondary | ICD-10-CM | POA: Diagnosis not present

## 2017-11-26 DIAGNOSIS — R1032 Left lower quadrant pain: Secondary | ICD-10-CM | POA: Diagnosis not present

## 2017-11-26 DIAGNOSIS — R1031 Right lower quadrant pain: Secondary | ICD-10-CM | POA: Diagnosis not present

## 2017-12-03 ENCOUNTER — Other Ambulatory Visit: Payer: Self-pay | Admitting: Internal Medicine

## 2017-12-03 DIAGNOSIS — Z1231 Encounter for screening mammogram for malignant neoplasm of breast: Secondary | ICD-10-CM

## 2017-12-10 DIAGNOSIS — K5792 Diverticulitis of intestine, part unspecified, without perforation or abscess without bleeding: Secondary | ICD-10-CM | POA: Diagnosis not present

## 2017-12-10 DIAGNOSIS — K5790 Diverticulosis of intestine, part unspecified, without perforation or abscess without bleeding: Secondary | ICD-10-CM | POA: Diagnosis not present

## 2018-01-06 DIAGNOSIS — Z23 Encounter for immunization: Secondary | ICD-10-CM | POA: Diagnosis not present

## 2018-01-07 ENCOUNTER — Ambulatory Visit
Admission: RE | Admit: 2018-01-07 | Discharge: 2018-01-07 | Disposition: A | Discharge status: Home / Self Care | Payer: Medicare Other | Source: Ambulatory Visit | Attending: Internal Medicine | Admitting: Internal Medicine

## 2018-01-07 DIAGNOSIS — Z1231 Encounter for screening mammogram for malignant neoplasm of breast: Secondary | ICD-10-CM

## 2018-01-19 DIAGNOSIS — Z780 Asymptomatic menopausal state: Secondary | ICD-10-CM | POA: Diagnosis not present

## 2018-05-26 DIAGNOSIS — J069 Acute upper respiratory infection, unspecified: Secondary | ICD-10-CM | POA: Diagnosis not present

## 2018-10-31 DIAGNOSIS — S90851A Superficial foreign body, right foot, initial encounter: Secondary | ICD-10-CM | POA: Diagnosis not present

## 2018-11-15 DIAGNOSIS — X32XXXA Exposure to sunlight, initial encounter: Secondary | ICD-10-CM | POA: Diagnosis not present

## 2018-11-15 DIAGNOSIS — D225 Melanocytic nevi of trunk: Secondary | ICD-10-CM | POA: Diagnosis not present

## 2018-11-15 DIAGNOSIS — Z1283 Encounter for screening for malignant neoplasm of skin: Secondary | ICD-10-CM | POA: Diagnosis not present

## 2018-11-15 DIAGNOSIS — L570 Actinic keratosis: Secondary | ICD-10-CM | POA: Diagnosis not present

## 2018-12-02 DIAGNOSIS — Z23 Encounter for immunization: Secondary | ICD-10-CM | POA: Diagnosis not present

## 2018-12-23 ENCOUNTER — Other Ambulatory Visit: Payer: Self-pay

## 2018-12-23 DIAGNOSIS — Z20828 Contact with and (suspected) exposure to other viral communicable diseases: Secondary | ICD-10-CM | POA: Diagnosis not present

## 2018-12-23 DIAGNOSIS — Z20822 Contact with and (suspected) exposure to covid-19: Secondary | ICD-10-CM

## 2018-12-25 LAB — NOVEL CORONAVIRUS, NAA: SARS-CoV-2, NAA: NOT DETECTED

## 2019-03-06 DIAGNOSIS — Z20828 Contact with and (suspected) exposure to other viral communicable diseases: Secondary | ICD-10-CM | POA: Diagnosis not present

## 2019-03-24 DIAGNOSIS — Z20828 Contact with and (suspected) exposure to other viral communicable diseases: Secondary | ICD-10-CM | POA: Diagnosis not present

## 2019-03-29 ENCOUNTER — Ambulatory Visit: Payer: Medicare Other | Attending: Internal Medicine

## 2019-03-29 DIAGNOSIS — Z23 Encounter for immunization: Secondary | ICD-10-CM | POA: Insufficient documentation

## 2019-03-29 NOTE — Progress Notes (Signed)
   Covid-19 Vaccination Clinic  Name:  [name]    MRN: [id number] DOB: October 29, 1950  03/29/2019  Autumn Lara was observed post Covid-19 immunization for 15 minutes without incidence. She was provided with Vaccine Information Sheet and instruction to access the V-Safe system.   Autumn Lara was instructed to call 911 with any severe reactions post vaccine: Marland Kitchen Difficulty breathing  . Swelling of your face and throat  . A fast heartbeat  . A bad rash all over your body  . Dizziness and weakness    Immunizations Administered    Name Date Dose VIS Date Route   Pfizer COVID-19 Vaccine 03/29/2019  5:59 PM 0.3 mL 02/17/2019 Intramuscular   Manufacturer: [place]   Lot: [id number]   [place]: [id number]

## 2019-04-04 ENCOUNTER — Other Ambulatory Visit: Payer: Self-pay | Admitting: Internal Medicine

## 2019-04-04 DIAGNOSIS — Z1231 Encounter for screening mammogram for malignant neoplasm of breast: Secondary | ICD-10-CM

## 2019-04-19 ENCOUNTER — Ambulatory Visit: Payer: Medicare Other | Attending: Internal Medicine

## 2019-04-19 DIAGNOSIS — Z23 Encounter for immunization: Secondary | ICD-10-CM | POA: Insufficient documentation

## 2019-04-19 NOTE — Progress Notes (Signed)
   Covid-19 Vaccination Clinic  Name:  Autumn Lara    MRN: [id number] DOB: Nov 17, 1950  04/19/2019  Autumn Lara was observed post Covid-19 immunization for 15 minutes without incidence. She was provided with Vaccine Information Sheet and instruction to access the V-Safe system.   Autumn Lara was instructed to call 911 with any severe reactions post vaccine: Marland Kitchen Difficulty breathing  . Swelling of your face and throat  . A fast heartbeat  . A bad rash all over your body  . Dizziness and weakness    Immunizations Administered    Name Date Dose VIS Date Route   Pfizer COVID-19 Vaccine 04/19/2019  5:50 PM 0.3 mL 02/17/2019 Intramuscular   Manufacturer: [place]   Lot: [id number]   [place]: [id number]

## 2019-05-01 ENCOUNTER — Ambulatory Visit
Admission: RE | Admit: 2019-05-01 | Discharge: 2019-05-01 | Disposition: A | Discharge status: Home / Self Care | Payer: Medicare Other | Source: Ambulatory Visit | Attending: Internal Medicine | Admitting: Internal Medicine

## 2019-05-01 ENCOUNTER — Other Ambulatory Visit: Payer: Self-pay

## 2019-05-01 DIAGNOSIS — Z1231 Encounter for screening mammogram for malignant neoplasm of breast: Secondary | ICD-10-CM

## 2019-11-28 DIAGNOSIS — Z23 Encounter for immunization: Secondary | ICD-10-CM | POA: Diagnosis not present

## 2019-11-29 DIAGNOSIS — Z20822 Contact with and (suspected) exposure to covid-19: Secondary | ICD-10-CM | POA: Diagnosis not present

## 2019-12-25 DIAGNOSIS — Z23 Encounter for immunization: Secondary | ICD-10-CM | POA: Diagnosis not present

## 2020-05-07 DIAGNOSIS — M13842 Other specified arthritis, left hand: Secondary | ICD-10-CM | POA: Diagnosis not present

## 2020-06-04 DIAGNOSIS — M13842 Other specified arthritis, left hand: Secondary | ICD-10-CM | POA: Diagnosis not present

## 2020-07-13 DIAGNOSIS — Z23 Encounter for immunization: Secondary | ICD-10-CM | POA: Diagnosis not present

## 2020-08-29 DIAGNOSIS — M13842 Other specified arthritis, left hand: Secondary | ICD-10-CM | POA: Diagnosis not present

## 2020-12-06 DIAGNOSIS — Z23 Encounter for immunization: Secondary | ICD-10-CM | POA: Diagnosis not present

## 2021-02-25 DIAGNOSIS — S0501XA Injury of conjunctiva and corneal abrasion without foreign body, right eye, initial encounter: Secondary | ICD-10-CM | POA: Diagnosis not present

## 2021-03-04 DIAGNOSIS — Z20822 Contact with and (suspected) exposure to covid-19: Secondary | ICD-10-CM | POA: Diagnosis not present

## 2021-04-08 ENCOUNTER — Other Ambulatory Visit: Payer: Self-pay | Admitting: Internal Medicine

## 2021-04-08 DIAGNOSIS — Z1231 Encounter for screening mammogram for malignant neoplasm of breast: Secondary | ICD-10-CM

## 2021-04-09 ENCOUNTER — Ambulatory Visit
Admission: RE | Admit: 2021-04-09 | Discharge: 2021-04-09 | Disposition: A | Discharge status: Home / Self Care | Payer: Medicare Other | Source: Ambulatory Visit | Attending: Internal Medicine | Admitting: Internal Medicine

## 2021-04-09 DIAGNOSIS — Z1231 Encounter for screening mammogram for malignant neoplasm of breast: Secondary | ICD-10-CM

## 2022-05-25 ENCOUNTER — Other Ambulatory Visit: Payer: Self-pay | Admitting: Internal Medicine

## 2022-05-25 DIAGNOSIS — Z1231 Encounter for screening mammogram for malignant neoplasm of breast: Secondary | ICD-10-CM

## 2022-05-27 ENCOUNTER — Other Ambulatory Visit: Payer: Self-pay | Admitting: Internal Medicine

## 2022-05-27 DIAGNOSIS — E782 Mixed hyperlipidemia: Secondary | ICD-10-CM

## 2022-07-07 ENCOUNTER — Ambulatory Visit: Payer: Medicare Other

## 2022-08-24 ENCOUNTER — Ambulatory Visit
Admission: RE | Admit: 2022-08-24 | Discharge: 2022-08-24 | Disposition: A | Discharge status: Home / Self Care | Payer: Medicare Other | Source: Ambulatory Visit | Attending: Internal Medicine | Admitting: Internal Medicine

## 2022-08-24 DIAGNOSIS — Z1231 Encounter for screening mammogram for malignant neoplasm of breast: Secondary | ICD-10-CM

## 2022-08-29 IMAGING — MG MM DIGITAL SCREENING BILAT W/ TOMO AND CAD
8 series · 8 of 24 positions shown · non-contrast
Comparison: Previous exam(s).

CLINICAL DATA: Screening.

EXAM:
DIGITAL SCREENING BILATERAL MAMMOGRAM WITH TOMOSYNTHESIS AND CAD
TECHNIQUE: Bilateral screening digital craniocaudal and mediolateral oblique
mammograms were obtained. Bilateral screening digital breast
tomosynthesis was performed. The images were evaluated with
computer-aided detection.

[L MLO synth-2D]
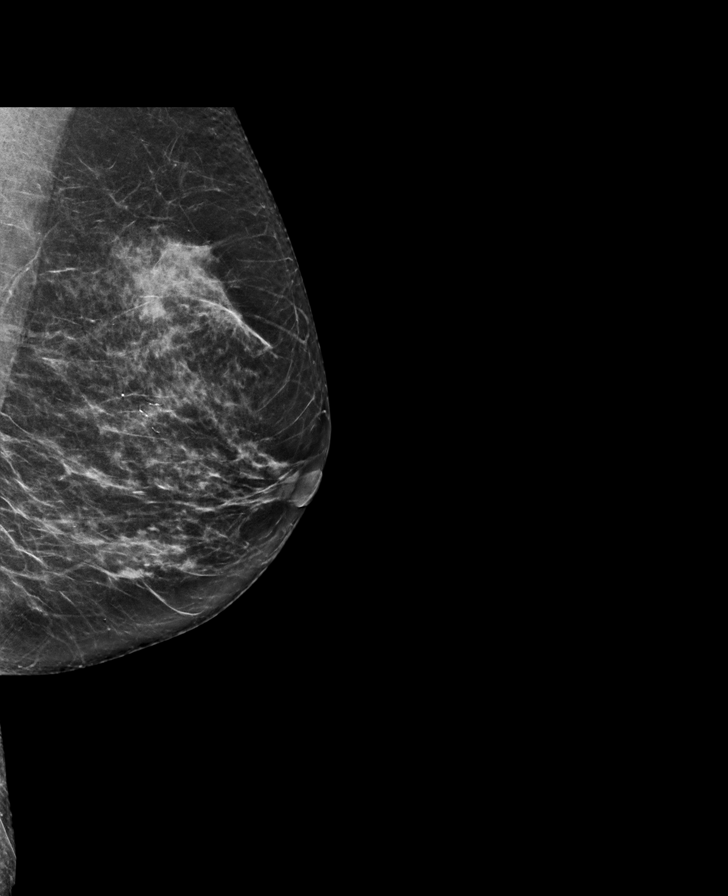

[L CC synth-2D]
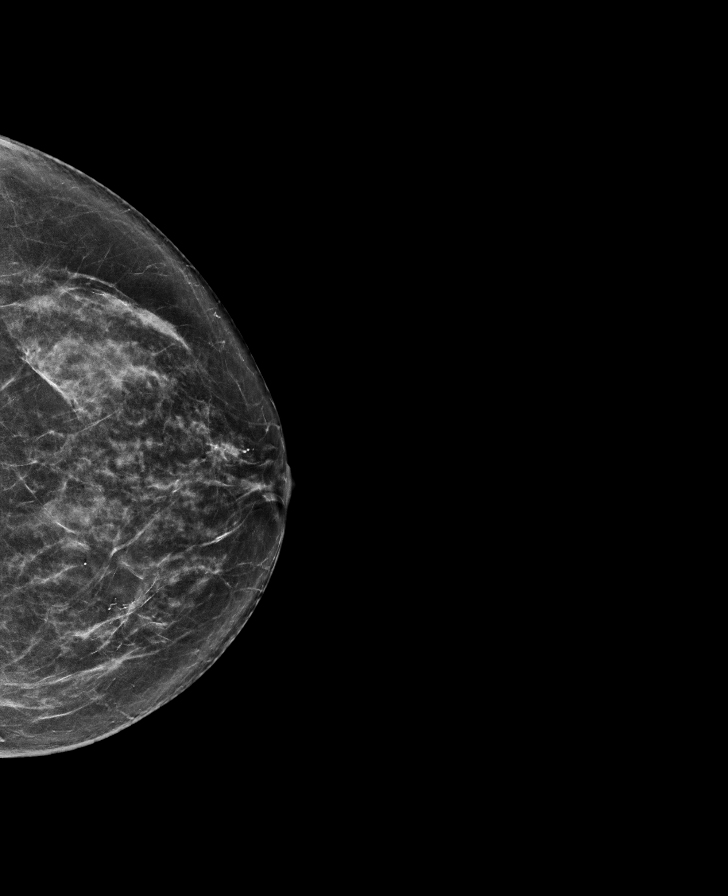

[R CC synth-2D]
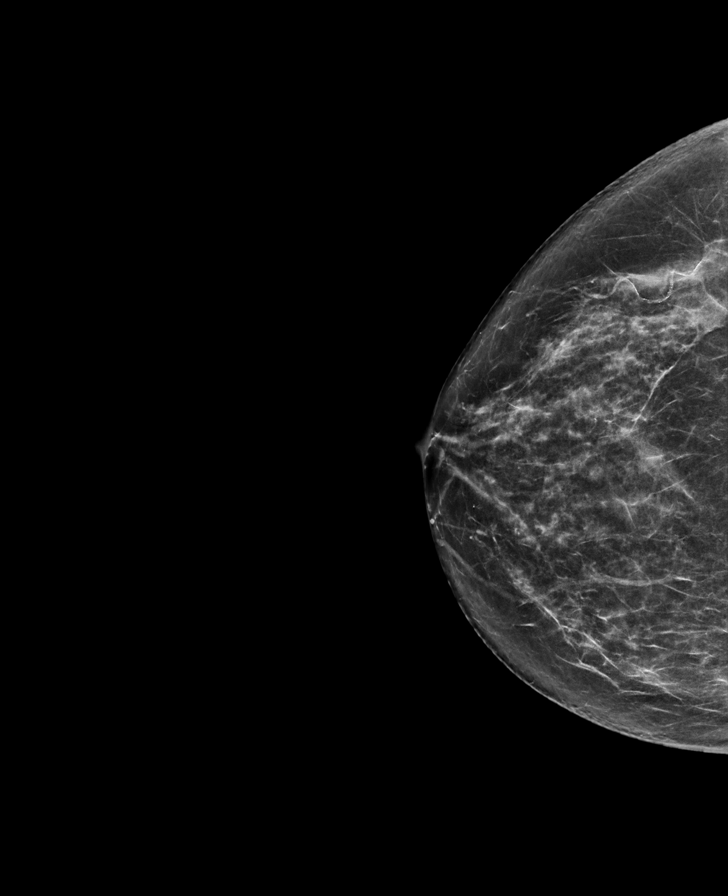

[R MLO synth-2D]
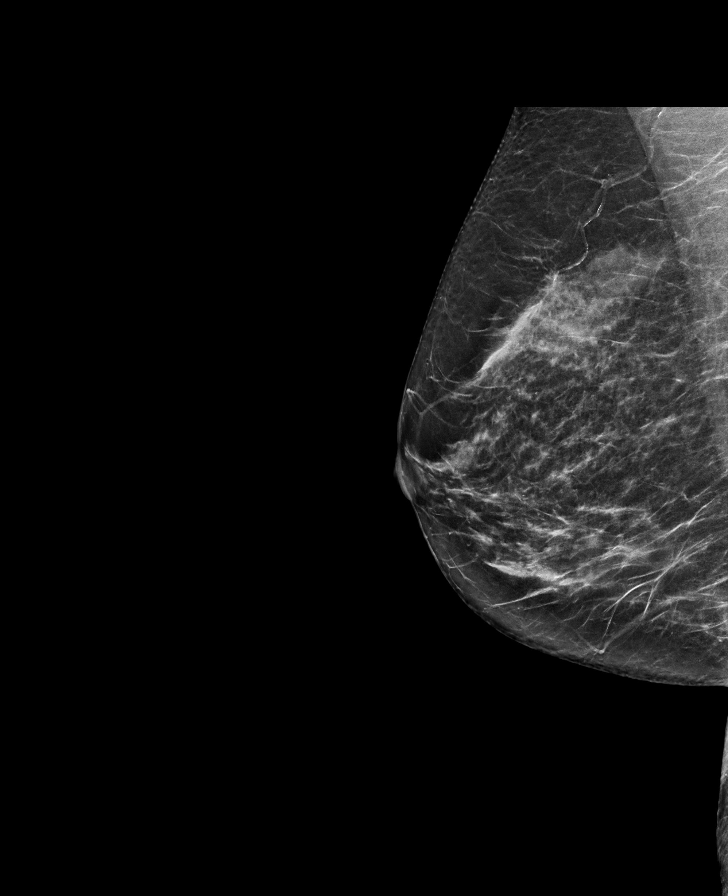

[L MLO tomo · tomo slice 35/70.0]
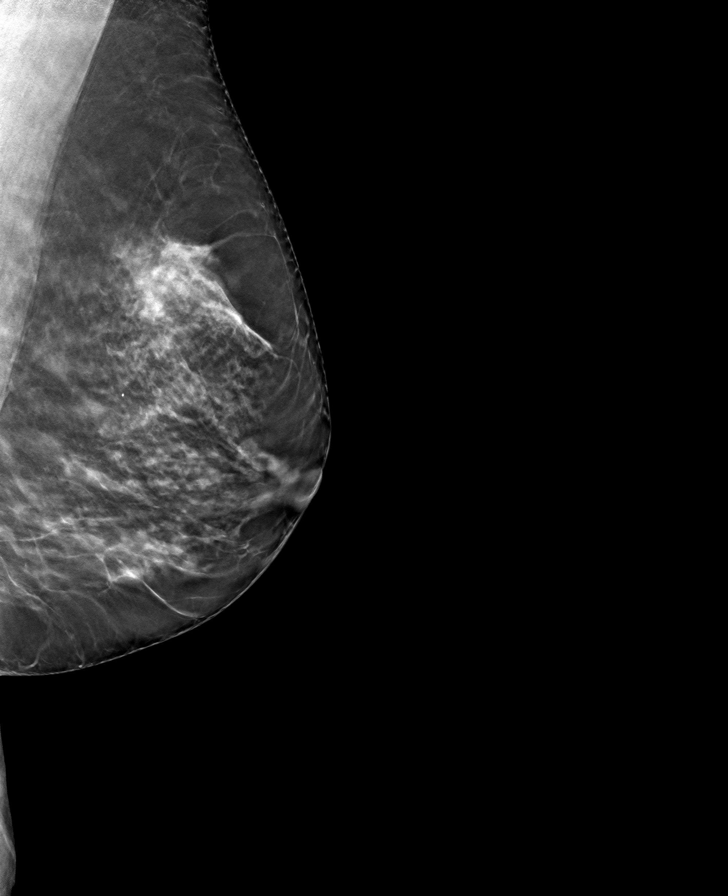

[L CC tomo · tomo slice 36/71.0]
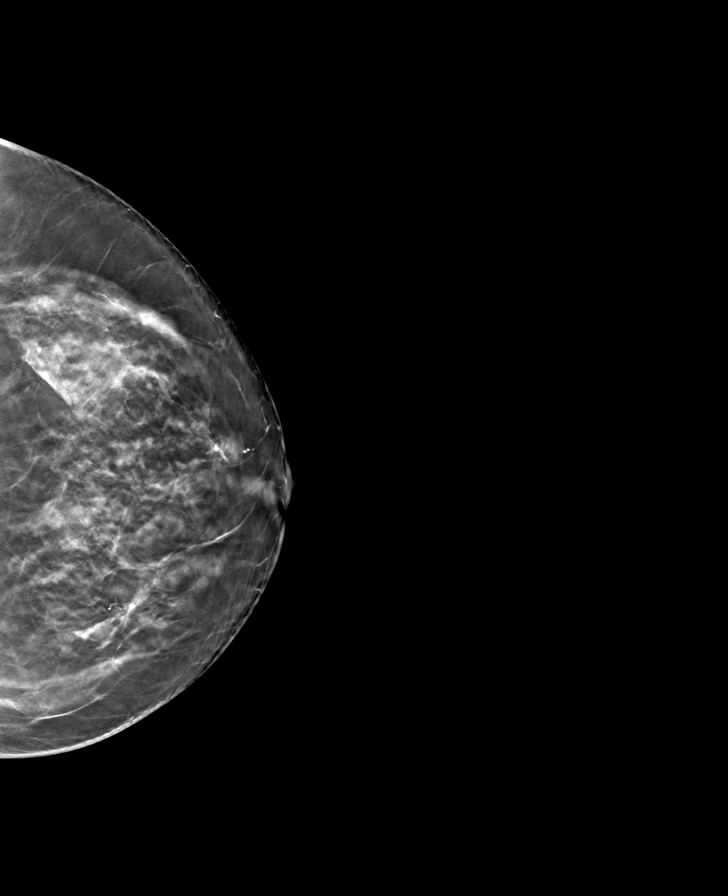

[R CC tomo · tomo slice 36/71.0]
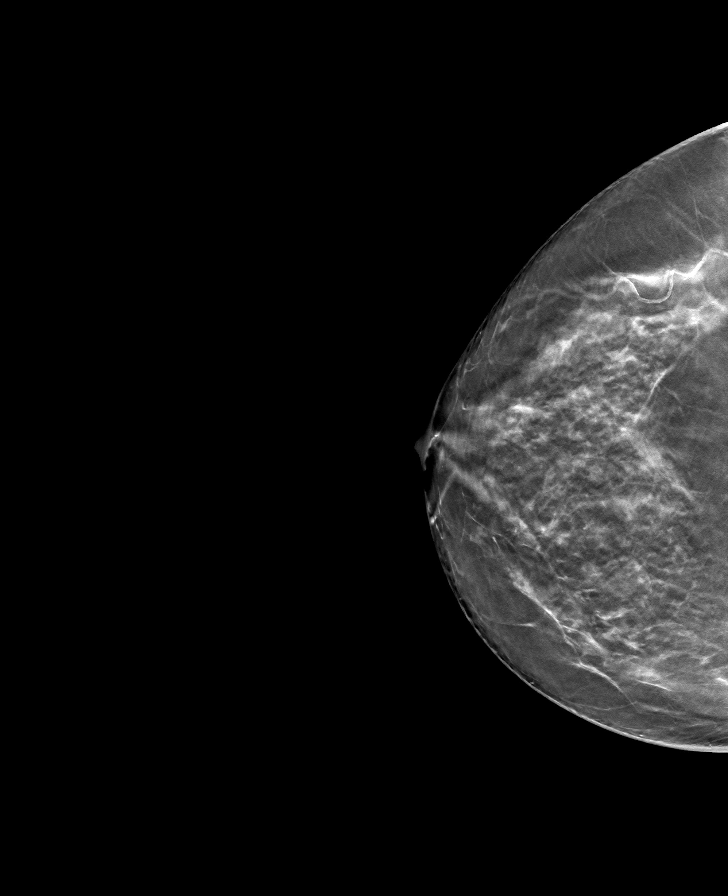

[R MLO tomo · tomo slice 35/70.0]
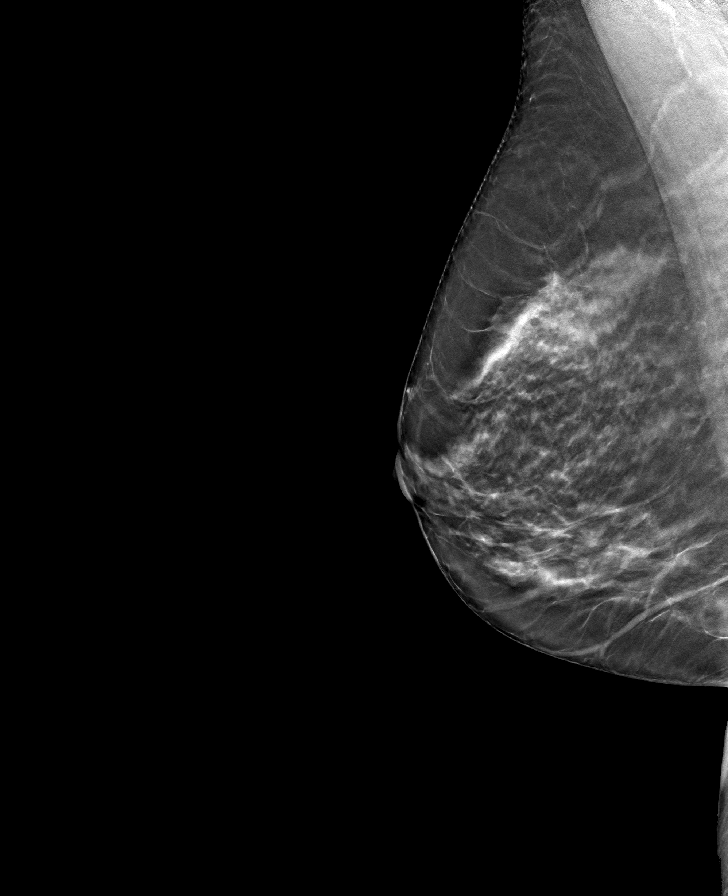

[8 of 24 positions shown; findings below may reference images not displayed]

ACR Breast Density Category c: The breast tissue is heterogeneously
dense, which may obscure small masses.
FINDINGS: There are no findings suspicious for malignancy.
IMPRESSION: No mammographic evidence of malignancy. A result letter of this
screening mammogram will be mailed directly to the patient.

RECOMMENDATION:
Screening mammogram in one year. (Code:Q3-W-BC3)

BI-RADS CATEGORY  1: Negative.

## 2022-12-02 ENCOUNTER — Ambulatory Visit
Admission: RE | Admit: 2022-12-02 | Discharge: 2022-12-02 | Disposition: A | Discharge status: Home / Self Care | Payer: Medicare Other | Source: Ambulatory Visit | Attending: Internal Medicine | Admitting: Internal Medicine

## 2022-12-02 ENCOUNTER — Inpatient Hospital Stay: Admission: RE | Admit: 2022-12-02 | Payer: Medicare Other | Source: Ambulatory Visit

## 2022-12-02 DIAGNOSIS — E782 Mixed hyperlipidemia: Secondary | ICD-10-CM

## 2022-12-03 ENCOUNTER — Encounter: Payer: Self-pay | Admitting: Internal Medicine

## 2022-12-03 DIAGNOSIS — Z780 Asymptomatic menopausal state: Secondary | ICD-10-CM

## 2022-12-04 ENCOUNTER — Encounter: Payer: Self-pay | Admitting: Internal Medicine

## 2022-12-04 DIAGNOSIS — Z780 Asymptomatic menopausal state: Secondary | ICD-10-CM

## 2023-12-14 ENCOUNTER — Other Ambulatory Visit: Payer: Self-pay | Admitting: Internal Medicine

## 2023-12-14 DIAGNOSIS — Z1231 Encounter for screening mammogram for malignant neoplasm of breast: Secondary | ICD-10-CM

## 2023-12-22 ENCOUNTER — Ambulatory Visit

## 2023-12-23 ENCOUNTER — Ambulatory Visit
Admission: RE | Admit: 2023-12-23 | Discharge: 2023-12-23 | Disposition: A | Discharge status: Home / Self Care | Source: Ambulatory Visit | Attending: Internal Medicine | Admitting: Internal Medicine

## 2023-12-23 DIAGNOSIS — Z1231 Encounter for screening mammogram for malignant neoplasm of breast: Secondary | ICD-10-CM

## 2024-03-15 ENCOUNTER — Other Ambulatory Visit: Payer: Self-pay | Admitting: Internal Medicine

## 2024-03-15 DIAGNOSIS — K56699 Other intestinal obstruction unspecified as to partial versus complete obstruction: Secondary | ICD-10-CM

## 2024-06-19 ENCOUNTER — Other Ambulatory Visit
